# Patient Record
Sex: Female | Born: 1975 | Race: Black or African American | Hispanic: No | Marital: Married | Smoking: Never smoker
Health system: Southern US, Community
[De-identification: ages and names within clinical notes are randomized; demographics above are authoritative.]

## PROBLEM LIST (undated history)

## (undated) DIAGNOSIS — G43909 Migraine, unspecified, not intractable, without status migrainosus: Secondary | ICD-10-CM

## (undated) DIAGNOSIS — D649 Anemia, unspecified: Secondary | ICD-10-CM

## (undated) HISTORY — PX: TUBAL LIGATION: SHX77

---

## 2016-05-12 ENCOUNTER — Encounter (HOSPITAL_COMMUNITY): Payer: Self-pay | Admitting: Emergency Medicine

## 2016-05-12 ENCOUNTER — Emergency Department (HOSPITAL_COMMUNITY)
Admission: EM | Admit: 2016-05-12 | Discharge: 2016-05-12 | Disposition: A | Discharge status: Home / Self Care | Payer: Self-pay | Attending: Emergency Medicine | Admitting: Emergency Medicine

## 2016-05-12 DIAGNOSIS — N9489 Other specified conditions associated with female genital organs and menstrual cycle: Secondary | ICD-10-CM | POA: Insufficient documentation

## 2016-05-12 DIAGNOSIS — E876 Hypokalemia: Secondary | ICD-10-CM | POA: Insufficient documentation

## 2016-05-12 DIAGNOSIS — R55 Syncope and collapse: Secondary | ICD-10-CM

## 2016-05-12 DIAGNOSIS — E861 Hypovolemia: Secondary | ICD-10-CM | POA: Insufficient documentation

## 2016-05-12 HISTORY — DX: Anemia, unspecified: D64.9

## 2016-05-12 LAB — I-STAT BETA HCG BLOOD, ED (MC, WL, AP ONLY)

## 2016-05-12 LAB — BASIC METABOLIC PANEL
Anion gap: 5 (ref 5–15)
BUN: 9 mg/dL (ref 6–20)
CALCIUM: 8.3 mg/dL — AB (ref 8.9–10.3)
CO2: 21 mmol/L — ABNORMAL LOW (ref 22–32)
CREATININE: 0.67 mg/dL (ref 0.44–1.00)
Chloride: 111 mmol/L (ref 101–111)
GFR calc Af Amer: >60 mL/min (ref >60)
GLUCOSE: 96 mg/dL (ref 65–99)
Potassium: 3.2 mmol/L — ABNORMAL LOW (ref 3.5–5.1)
SODIUM: 137 mmol/L (ref 135–145)

## 2016-05-12 LAB — CBC
HEMATOCRIT: 40.6 % (ref 36.0–46.0)
Hemoglobin: 12.6 g/dL (ref 12.0–15.0)
MCH: 24.8 pg — ABNORMAL LOW (ref 26.0–34.0)
MCHC: 31 g/dL (ref 30.0–36.0)
MCV: 79.9 fL (ref 78.0–100.0)
PLATELETS: 224 10*3/uL (ref 150–400)
RBC: 5.08 MIL/uL (ref 3.87–5.11)
RDW: 14.4 % (ref 11.5–15.5)
WBC: 5.7 10*3/uL (ref 4.0–10.5)

## 2016-05-12 LAB — CBG MONITORING, ED: Glucose-Capillary: 96 mg/dL (ref 65–99)

## 2016-05-12 MED ORDER — POTASSIUM CHLORIDE CRYS ER 20 MEQ PO TBCR: 20.0000 meq | EXTENDED_RELEASE_TABLET | Freq: Every day | ORAL | 0 refills | Status: DC

## 2016-05-12 MED ORDER — SODIUM CHLORIDE 0.9 % IV BOLUS (SEPSIS)
1000.0000 mL | Freq: Once | INTRAVENOUS | Status: AC
  Administered 2016-05-12: 1000 mL via INTRAVENOUS

## 2016-05-12 NOTE — ED Notes (Signed)
Pt's was asked if she could give a urine sample, but she stated that she cannot but will try later.

## 2016-05-12 NOTE — Discharge Instructions (Signed)
Do not hesitate to return to the emergency room for any new, worsening or concerning symptoms. ° °Please obtain primary care using resource guide below. Let them know that you were seen in the emergency room and that they will need to obtain records for further outpatient management. ° ° °

## 2016-05-12 NOTE — ED Notes (Signed)
While standing during orthostatic vitals, pt stated that she felt "lightheaded, nauseous and her head hurt". Informed Brooke - RN and Joni ReiningNicole - PA.

## 2016-05-12 NOTE — ED Triage Notes (Signed)
Pt presents to the ED via EMS. Patient reports giving plasma earlier today. Pt reports about 2 hours ago she gave plasma. Per EMS Pt was at the grocery store and lost consciousness for about 4 minutes. Per EMS Pt lost consciousness again on the way to the hospital.

## 2016-05-12 NOTE — ED Provider Notes (Signed)
[facility]-EMERGENCY DEPT Provider Note   CSN: [id number] Arrival date & time: 05/12/16  1339     History   Chief Complaint Chief Complaint  Patient presents with  . Loss of Consciousness    HPI  Blood pressure 91/70, pulse (!) 58, temperature 98.4 F (36.9 C), temperature source Oral, resp. rate 18, height 5\' 5"  (1.651 m), weight 90.7 kg, last menstrual period 05/03/2016, SpO2 97 %.  Caroline King is a 40 y.o. female complaining of syncopal event just prior to arrival, patient gave plasma this a.m., she's been in normal state of health leading up to this. She went to the grocery store after giving plasma she was in the checkout line and she began to feel shaky, lightheaded, nauseous she did syncopized, she has a left occipital headache or she thinks she hit her head. There was no tonic-clonic movement reported by bystanders, no incontinence. She does have chronic anemia which she says is secondary to heavy menses. Last menstrual period was August 26 that lasted for 7 days which is typical for her, she doesn't have OB/GYN because she recently moved to the area and doesn't have insurance. She started taking over-the-counter iron supplements several days ago. She denies chest pain, palpitations, shortness of breath, abdominal pain, history of DVT/PE, recent mobilizations, calf pain, leg swelling.  HPI  Past Medical History:  Diagnosis Date  . Anemia     There are no active problems to display for this patient.   Past Surgical History:  Procedure Laterality Date  . CESAREAN SECTION      OB History    No data available       Home Medications    Prior to Admission medications   Medication Sig Start Date End Date Taking? Authorizing Provider  potassium chloride SA (K-DUR,KLOR-CON) 20 MEQ tablet Take 1 tablet (20 mEq total) by mouth daily. 05/12/16   Joni ReiningNicole Maxine Fredman, PA-C    Family History No family history on file.  Social History Social History  Substance Use Topics  .  Smoking status: Never Smoker  . Smokeless tobacco: Never Used  . Alcohol use No     Allergies   Review of patient's allergies indicates no known allergies.   Review of Systems Review of Systems  10 systems reviewed and found to be negative, except as noted in the HPI.   Physical Exam Updated Vital Signs BP 94/73   Pulse (!) 55   Temp 98.4 F (36.9 C) (Oral)   Resp 15   Ht 5\' 5"  (1.651 m)   Wt 90.7 kg   LMP 05/03/2016 (Exact Date)   SpO2 100%   BMI 33.28 kg/m   Physical Exam  Constitutional: She is oriented to person, place, and time. She appears well-developed and well-nourished. No distress.  HENT:  Head: Normocephalic and atraumatic.  Mouth/Throat: Oropharynx is clear and moist.  No conjunctival pallor  Eyes: Conjunctivae and EOM are normal. Pupils are equal, round, and reactive to light.  Neck: Normal range of motion. No JVD present. No tracheal deviation present.  Cardiovascular: Normal rate, regular rhythm and intact distal pulses.   Radial pulse equal bilaterally  Pulmonary/Chest: Effort normal and breath sounds normal. No stridor. No respiratory distress. She has no wheezes. She has no rales. She exhibits no tenderness.  Abdominal: Soft. She exhibits no distension and no mass. There is no tenderness. There is no rebound and no guarding. No hernia.  Musculoskeletal: Normal range of motion. She exhibits no edema or tenderness.  No calf  asymmetry, superficial collaterals, palpable cords, edema, Homans sign negative bilaterally.    Neurological: She is alert and oriented to person, place, and time.  Skin: Skin is warm. She is not diaphoretic.  Psychiatric: She has a normal mood and affect.  Nursing note and vitals reviewed.    ED Treatments / Results  Labs (all labs ordered are listed, but only abnormal results are displayed) Labs Reviewed  BASIC METABOLIC PANEL - Abnormal; Notable for the following:       Result Value   Potassium 3.2 (*)    CO2 21 (*)      Calcium 8.3 (*)    All other components within normal limits  CBC - Abnormal; Notable for the following:    MCH 24.8 (*)    All other components within normal limits  CBG MONITORING, ED  I-STAT BETA HCG BLOOD, ED ([facility], [facility], [facility] ONLY)    EKG  EKG Interpretation  Date/Time:  Monday May 12 2016 13:56:07 EDT Ventricular Rate:  59 PR Interval:    QRS Duration: 94 QT Interval:  416 QTC Calculation: 413 R Axis:   68 Text Interpretation:  Sinus rhythm Borderline T abnormalities, anterior leads No old tracing to compare Confirmed by Ethelda Chick  MD, SAM [phone]) on 05/12/2016 2:28:10 PM       Radiology No results found.  Procedures Procedures (including critical care time)  Medications Ordered in ED Medications  sodium chloride 0.9 % bolus 1,000 mL (1,000 mLs Intravenous New Bag/Given 05/12/16 1430)     Initial Impression / Assessment and Plan / ED Course  I have reviewed the triage vital signs and the nursing notes.  Pertinent labs & imaging results that were available during my care of the patient were reviewed by me and considered in my medical decision making (see chart for details).  Clinical Course    Vitals:   05/12/16 1345 05/12/16 1346 05/12/16 1500 05/12/16 1515  BP: 91/70  97/71 94/73  Pulse: (!) 58  (!) 58 (!) 55  Resp: 18  23 15   Temp: 98.4 F (36.9 C)     TempSrc: Oral     SpO2: 97%  98% 100%  Weight:  90.7 kg    Height:  5\' 5"  (1.651 m)      Medications  sodium chloride 0.9 % bolus 1,000 mL (1,000 mLs Intravenous New Bag/Given 05/12/16 1430)    Caroline King is 40 y.o. female presenting with Syncope after giving plasma earlier in the day, there was a prodrome of lightheadedness, nausea. No tonic-clonic movement or incontinence to suggest seizure. She is anemic at her baseline and is taking iron supplements. No anemia seen on CBC, bicarbonate is mildly low at 21 and potassium is 3.2. She's not pregnant. No abnormality on abdominal exam. EKG is without  arrhythmia or ischemic changes. Physical exam is not consistent with DVT and I doubt PE.  Agent is reporting lightheaded sensation when she stands however there is no significant drop in her systolic blood pressure. I think patient is dehydrated, but advised her to push fluids, do not stand quickly. She will be given a referral to OB/GYN for heavy menses.  Evaluation does not show pathology that would require ongoing emergent intervention or inpatient treatment. Pt is hemodynamically stable and mentating appropriately. Discussed findings and plan with patient/guardian, who agrees with care plan. All questions answered. Return precautions discussed and outpatient follow up given.      Final Clinical Impressions(s) / ED Diagnoses   Final diagnoses:  Syncope,  unspecified syncope type  Hypovolemia  Hypokalemia    New Prescriptions New Prescriptions   POTASSIUM CHLORIDE SA (K-DUR,KLOR-CON) 20 MEQ TABLET    Take 1 tablet (20 mEq total) by mouth daily.     Wynetta Emery, PA-C 05/12/16 1556    Doug Sou, MD 05/12/16 1718

## 2017-09-16 ENCOUNTER — Encounter (HOSPITAL_COMMUNITY): Payer: Self-pay | Admitting: *Deleted

## 2017-09-16 ENCOUNTER — Emergency Department (HOSPITAL_COMMUNITY)
Admission: EM | Admit: 2017-09-16 | Discharge: 2017-09-16 | Disposition: A | Discharge status: Home / Self Care | Payer: Self-pay | Attending: Emergency Medicine | Admitting: Emergency Medicine

## 2017-09-16 ENCOUNTER — Emergency Department (HOSPITAL_COMMUNITY): Payer: Self-pay

## 2017-09-16 DIAGNOSIS — Y9389 Activity, other specified: Secondary | ICD-10-CM | POA: Insufficient documentation

## 2017-09-16 DIAGNOSIS — S301XXA Contusion of abdominal wall, initial encounter: Secondary | ICD-10-CM | POA: Insufficient documentation

## 2017-09-16 DIAGNOSIS — Y999 Unspecified external cause status: Secondary | ICD-10-CM | POA: Insufficient documentation

## 2017-09-16 DIAGNOSIS — Y92410 Unspecified street and highway as the place of occurrence of the external cause: Secondary | ICD-10-CM | POA: Insufficient documentation

## 2017-09-16 LAB — I-STAT CHEM 8, ED
BUN: 10 mg/dL (ref 6–20)
Calcium, Ion: 1.27 mmol/L (ref 1.15–1.40)
Chloride: 105 mmol/L (ref 101–111)
Creatinine, Ser: 0.8 mg/dL (ref 0.44–1.00)
Glucose, Bld: 102 mg/dL — ABNORMAL HIGH (ref 65–99)
HCT: 33 % — ABNORMAL LOW (ref 36.0–46.0)
Hemoglobin: 11.2 g/dL — ABNORMAL LOW (ref 12.0–15.0)
POTASSIUM: 4.2 mmol/L (ref 3.5–5.1)
SODIUM: 142 mmol/L (ref 135–145)
TCO2: 25 mmol/L (ref 22–32)

## 2017-09-16 LAB — CBC WITH DIFFERENTIAL/PLATELET
BASOS PCT: 0 %
Basophils Absolute: 0 10*3/uL (ref 0.0–0.1)
EOS PCT: 3 %
Eosinophils Absolute: 0.2 10*3/uL (ref 0.0–0.7)
HEMATOCRIT: 33.1 % — AB (ref 36.0–46.0)
Hemoglobin: 10.1 g/dL — ABNORMAL LOW (ref 12.0–15.0)
Lymphocytes Relative: 26 %
Lymphs Abs: 1.9 10*3/uL (ref 0.7–4.0)
MCH: 23.6 pg — ABNORMAL LOW (ref 26.0–34.0)
MCHC: 30.5 g/dL (ref 30.0–36.0)
MCV: 77.3 fL — ABNORMAL LOW (ref 78.0–100.0)
MONO ABS: 0.8 10*3/uL (ref 0.1–1.0)
MONOS PCT: 11 %
Neutro Abs: 4.5 10*3/uL (ref 1.7–7.7)
Neutrophils Relative %: 60 %
PLATELETS: 394 10*3/uL (ref 150–400)
RBC: 4.28 MIL/uL (ref 3.87–5.11)
RDW: 15.6 % — AB (ref 11.5–15.5)
WBC: 7.4 10*3/uL (ref 4.0–10.5)

## 2017-09-16 LAB — COMPREHENSIVE METABOLIC PANEL
ALBUMIN: 3.6 g/dL (ref 3.5–5.0)
ALT: 11 U/L — ABNORMAL LOW (ref 14–54)
ANION GAP: 7 (ref 5–15)
AST: 22 U/L (ref 15–41)
Alkaline Phosphatase: 62 U/L (ref 38–126)
BILIRUBIN TOTAL: 0.3 mg/dL (ref 0.3–1.2)
BUN: 12 mg/dL (ref 6–20)
CO2: 24 mmol/L (ref 22–32)
Calcium: 9.4 mg/dL (ref 8.9–10.3)
Chloride: 108 mmol/L (ref 101–111)
Creatinine, Ser: 0.82 mg/dL (ref 0.44–1.00)
GFR calc Af Amer: >60 mL/min (ref >60)
Glucose, Bld: 105 mg/dL — ABNORMAL HIGH (ref 65–99)
POTASSIUM: 3.9 mmol/L (ref 3.5–5.1)
Sodium: 139 mmol/L (ref 135–145)
TOTAL PROTEIN: 7.5 g/dL (ref 6.5–8.1)

## 2017-09-16 LAB — URINALYSIS, ROUTINE W REFLEX MICROSCOPIC
BILIRUBIN URINE: NEGATIVE
GLUCOSE, UA: NEGATIVE mg/dL
KETONES UR: NEGATIVE mg/dL
Leukocytes, UA: NEGATIVE
NITRITE: NEGATIVE
Protein, ur: NEGATIVE mg/dL
Specific Gravity, Urine: 1.005 (ref 1.005–1.030)
pH: 7 (ref 5.0–8.0)

## 2017-09-16 LAB — I-STAT BETA HCG BLOOD, ED (MC, WL, AP ONLY)

## 2017-09-16 MED ORDER — IOPAMIDOL (ISOVUE-300) INJECTION 61%
100.0000 mL | Freq: Once | INTRAVENOUS | Status: AC | PRN
  Administered 2017-09-16: 100 mL via INTRAVENOUS

## 2017-09-16 MED ORDER — SODIUM CHLORIDE 0.9 % IV BOLUS (SEPSIS)
1000.0000 mL | Freq: Once | INTRAVENOUS | Status: AC
  Administered 2017-09-16: 1000 mL via INTRAVENOUS

## 2017-09-16 MED ORDER — IOPAMIDOL (ISOVUE-300) INJECTION 61%
INTRAVENOUS | Status: AC
  Filled 2017-09-16: qty 100

## 2017-09-16 MED ORDER — IBUPROFEN 600 MG PO TABS: 600.0000 mg | ORAL_TABLET | Freq: Four times a day (QID) | ORAL | 0 refills | Status: DC | PRN

## 2017-09-16 MED ORDER — HYDROMORPHONE HCL 1 MG/ML IJ SOLN
1.0000 mg | Freq: Once | INTRAMUSCULAR | Status: AC
  Administered 2017-09-16: 1 mg via INTRAVENOUS
  Filled 2017-09-16: qty 1

## 2017-09-16 MED ORDER — BACITRACIN ZINC 500 UNIT/GM EX OINT: 1.0000 "application " | TOPICAL_OINTMENT | Freq: Two times a day (BID) | CUTANEOUS | 0 refills | Status: DC
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
## 2017-09-16 NOTE — ED Notes (Signed)
MADE 1ST URINE REQUEST PATIENT UNABLE TO PROVIDE ONE AT THIS TIME 

## 2018-01-15 ENCOUNTER — Encounter (HOSPITAL_COMMUNITY): Payer: Self-pay | Admitting: Emergency Medicine

## 2018-01-15 ENCOUNTER — Emergency Department (HOSPITAL_COMMUNITY)
Admission: EM | Admit: 2018-01-15 | Discharge: 2018-01-15 | Disposition: A | Discharge status: Home / Self Care | Payer: 59 | Attending: Emergency Medicine | Admitting: Emergency Medicine

## 2018-01-15 DIAGNOSIS — G43909 Migraine, unspecified, not intractable, without status migrainosus: Secondary | ICD-10-CM | POA: Diagnosis not present

## 2018-01-15 DIAGNOSIS — H9319 Tinnitus, unspecified ear: Secondary | ICD-10-CM | POA: Diagnosis not present

## 2018-01-15 DIAGNOSIS — R51 Headache: Secondary | ICD-10-CM | POA: Diagnosis not present

## 2018-01-15 DIAGNOSIS — R112 Nausea with vomiting, unspecified: Secondary | ICD-10-CM | POA: Diagnosis not present

## 2018-01-15 DIAGNOSIS — R42 Dizziness and giddiness: Secondary | ICD-10-CM

## 2018-01-15 DIAGNOSIS — H9209 Otalgia, unspecified ear: Secondary | ICD-10-CM | POA: Insufficient documentation

## 2018-01-15 HISTORY — DX: Migraine, unspecified, not intractable, without status migrainosus: G43.909

## 2018-01-15 LAB — COMPREHENSIVE METABOLIC PANEL
ALBUMIN: 3.5 g/dL (ref 3.5–5.0)
ALK PHOS: 69 U/L (ref 38–126)
ALT: 11 U/L — ABNORMAL LOW (ref 14–54)
ANION GAP: 9 (ref 5–15)
AST: 17 U/L (ref 15–41)
BILIRUBIN TOTAL: 0.2 mg/dL — AB (ref 0.3–1.2)
BUN: 14 mg/dL (ref 6–20)
CALCIUM: 8.9 mg/dL (ref 8.9–10.3)
CO2: 20 mmol/L — ABNORMAL LOW (ref 22–32)
Chloride: 109 mmol/L (ref 101–111)
Creatinine, Ser: 0.78 mg/dL (ref 0.44–1.00)
GLUCOSE: 93 mg/dL (ref 65–99)
Potassium: 4 mmol/L (ref 3.5–5.1)
Sodium: 138 mmol/L (ref 135–145)
TOTAL PROTEIN: 7.7 g/dL (ref 6.5–8.1)

## 2018-01-15 LAB — URINALYSIS, ROUTINE W REFLEX MICROSCOPIC
BILIRUBIN URINE: NEGATIVE
Glucose, UA: NEGATIVE mg/dL
HGB URINE DIPSTICK: NEGATIVE
Ketones, ur: NEGATIVE mg/dL
Leukocytes, UA: NEGATIVE
NITRITE: POSITIVE — AB
Protein, ur: NEGATIVE mg/dL
SPECIFIC GRAVITY, URINE: 1.013 (ref 1.005–1.030)
pH: 7 (ref 5.0–8.0)

## 2018-01-15 LAB — CBC
HCT: 33.9 % — ABNORMAL LOW (ref 36.0–46.0)
HEMOGLOBIN: 10.3 g/dL — AB (ref 12.0–15.0)
MCH: 23.2 pg — ABNORMAL LOW (ref 26.0–34.0)
MCHC: 30.4 g/dL (ref 30.0–36.0)
MCV: 76.4 fL — ABNORMAL LOW (ref 78.0–100.0)
Platelets: 383 10*3/uL (ref 150–400)
RBC: 4.44 MIL/uL (ref 3.87–5.11)
RDW: 16.3 % — ABNORMAL HIGH (ref 11.5–15.5)
WBC: 4.6 10*3/uL (ref 4.0–10.5)

## 2018-01-15 LAB — I-STAT BETA HCG BLOOD, ED (MC, WL, AP ONLY): I-stat hCG, quantitative: <5 m[IU]/mL (ref <5)

## 2018-01-15 LAB — LIPASE, BLOOD: Lipase: 40 U/L (ref 11–51)

## 2018-01-15 MED ORDER — MECLIZINE HCL 25 MG PO TABS
25.0000 mg | ORAL_TABLET | Freq: Once | ORAL | Status: AC
  Administered 2018-01-15: 25 mg via ORAL
  Filled 2018-01-15: qty 1

## 2018-01-15 MED ORDER — ONDANSETRON 4 MG PO TBDP
4.0000 mg | ORAL_TABLET | Freq: Once | ORAL | Status: AC | PRN
  Administered 2018-01-15: 4 mg via ORAL
  Filled 2018-01-15: qty 1

## 2018-01-15 MED ORDER — SODIUM CHLORIDE 0.9 % IV BOLUS
1000.0000 mL | Freq: Once | INTRAVENOUS | Status: AC
  Administered 2018-01-15: 1000 mL via INTRAVENOUS

## 2018-01-15 MED ORDER — METOCLOPRAMIDE HCL 5 MG/ML IJ SOLN
10.0000 mg | Freq: Once | INTRAMUSCULAR | Status: AC
  Administered 2018-01-15: 10 mg via INTRAVENOUS
  Filled 2018-01-15: qty 2

## 2018-01-15 MED ORDER — MECLIZINE HCL 25 MG PO TABS: 25.0000 mg | ORAL_TABLET | Freq: Three times a day (TID) | ORAL | 0 refills | Status: DC | PRN

## 2018-01-15 MED ORDER — KETOROLAC TROMETHAMINE 30 MG/ML IJ SOLN
30.0000 mg | Freq: Once | INTRAMUSCULAR | Status: AC
  Administered 2018-01-15: 30 mg via INTRAVENOUS
  Filled 2018-01-15: qty 1

## 2018-01-15 NOTE — Discharge Instructions (Addendum)
You were evaluated in the emergency department for acute spinning sensation when you woke up this morning.  This is likely vertigo and you were improved after taking some medication for headache and dizziness.  We are prescribing you meclizine to go home with as this may help limit your symptoms.  You should return to the emergency department if you have any worsening of your symptoms or any new concerns.

## 2018-01-15 NOTE — ED Provider Notes (Signed)
[facility]-EMERGENCY DEPT Provider Note   CSN: [id number] Arrival date & time: 01/15/18  4098     History   Chief Complaint Chief Complaint  Patient presents with  . Dizziness  . Otalgia  . Emesis  . Headache    HPI Caroline King is a 42 y.o. female.  42 year old female with history of migraines here with acute onset of room spinning when she got up out of bed this morning.  She is never had this before.  She states she feels drunk and unsteady on her feet. The associated with nausea and vomiting and is giving her headache.  She is a headache is like her migraines but her room spinning is something that she is never had before.  There is been no fever no trauma.  No new medications.  The history is provided by the patient.  Dizziness  Quality:  Head spinning and room spinning Severity:  Severe Onset quality:  Sudden Timing:  Intermittent Progression:  Partially resolved Chronicity:  New Context: head movement and standing up   Context: not with loss of consciousness   Relieved by:  Being still Worsened by:  Movement, standing up and turning head Associated symptoms: headaches, nausea, tinnitus and vomiting   Associated symptoms: no blood in stool, no chest pain, no diarrhea, no shortness of breath and no vision changes   Risk factors: anemia   Risk factors: no hx of vertigo   Otalgia  Associated symptoms include headaches and vomiting. Pertinent negatives include no sore throat, no abdominal pain, no diarrhea and no rash.  Emesis   Associated symptoms include headaches. Pertinent negatives include no abdominal pain, no diarrhea and no fever.  Headache   Associated symptoms include nausea and vomiting. Pertinent negatives include no fever and no shortness of breath.    Past Medical History:  Diagnosis Date  . Anemia   . Migraines     There are no active problems to display for this patient.   Past Surgical History:  Procedure Laterality  Date  . CESAREAN SECTION       OB History   None      Home Medications    Prior to Admission medications   Medication Sig Start Date End Date Taking? Authorizing Provider  bacitracin ointment Apply 1 application topically 2 (two) times daily. 09/16/17   Derwood Kaplan, MD  ibuprofen (ADVIL,MOTRIN) 600 MG tablet Take 1 tablet (600 mg total) by mouth every 6 (six) hours as needed. 09/16/17   Derwood Kaplan, MD  potassium chloride SA (K-DUR,KLOR-CON) 20 MEQ tablet Take 1 tablet (20 mEq total) by mouth daily. Patient not taking: Reported on 09/16/2017 05/12/16   Pisciotta, Mardella Layman    Family History No family history on file.  Social History Social History   Tobacco Use  . Smoking status: Never Smoker  . Smokeless tobacco: Never Used  Substance Use Topics  . Alcohol use: No  . Drug use: No     Allergies   Patient has no known allergies.   Review of Systems Review of Systems  Constitutional: Negative for fever.  HENT: Positive for ear pain and tinnitus. Negative for sore throat.   Respiratory: Negative for shortness of breath.   Cardiovascular: Negative for chest pain.  Gastrointestinal: Positive for nausea and vomiting. Negative for abdominal pain, blood in stool and diarrhea.  Genitourinary: Negative for dysuria.  Skin: Negative for rash.  Neurological: Positive for dizziness and headaches.     Physical Exam Updated Vital  Signs BP 112/88 (BP Location: Left Arm)   Pulse 65   Temp 98.4 F (36.9 C) (Oral)   Resp 18   Wt 94.6 kg (208 lb 9 oz)   LMP 12/28/2017   SpO2 98%   BMI 34.71 kg/m   Physical Exam  Constitutional: She is oriented to person, place, and time. She appears well-developed and well-nourished. No distress.  HENT:  Head: Normocephalic and atraumatic.  Eyes: Pupils are equal, round, and reactive to light. Conjunctivae are normal. Right eye exhibits nystagmus. Right eye exhibits normal extraocular motion. Left eye exhibits nystagmus. Left eye  exhibits normal extraocular motion.  Neck: Neck supple.  Cardiovascular: Normal rate and regular rhythm.  No murmur heard. Pulmonary/Chest: Effort normal and breath sounds normal. No respiratory distress.  Abdominal: Soft. There is no tenderness.  Musculoskeletal: She exhibits no edema.  Neurological: She is alert and oriented to person, place, and time. She has normal strength. A cranial nerve deficit is present. No sensory deficit. GCS eye subscore is 4. GCS verbal subscore is 5. GCS motor subscore is 6.  Skin: Skin is warm and dry.  Psychiatric: She has a normal mood and affect.  Nursing note and vitals reviewed.    ED Treatments / Results  Labs (all labs ordered are listed, but only abnormal results are displayed) Labs Reviewed  COMPREHENSIVE METABOLIC PANEL - Abnormal; Notable for the following components:      Result Value   CO2 20 (*)    ALT 11 (*)    Total Bilirubin 0.2 (*)    All other components within normal limits  CBC - Abnormal; Notable for the following components:   Hemoglobin 10.3 (*)    HCT 33.9 (*)    MCV 76.4 (*)    MCH 23.2 (*)    RDW 16.3 (*)    All other components within normal limits  LIPASE, BLOOD  URINALYSIS, ROUTINE W REFLEX MICROSCOPIC  I-STAT BETA HCG BLOOD, ED ([facility], [facility], [facility] ONLY)    EKG None  Radiology No results found.  Procedures Procedures (including critical care time)  Medications Ordered in ED Medications  metoCLOPramide (REGLAN) injection 10 mg (has no administration in time range)  ketorolac (TORADOL) 30 MG/ML injection 30 mg (has no administration in time range)  sodium chloride 0.9 % bolus 1,000 mL (has no administration in time range)  meclizine (ANTIVERT) tablet 25 mg (has no administration in time range)  ondansetron (ZOFRAN-ODT) disintegrating tablet 4 mg (4 mg Oral Given 01/15/18 0800)     Initial Impression / Assessment and Plan / ED Course  I have reviewed the triage vital signs and the nursing notes.  Pertinent  labs & imaging results that were available during my care of the patient were reviewed by me and considered in my medical decision making (see chart for details).  Clinical Course as of Jan 17 1342  Fri Jan 15, 2018  1412 Reevaluated patient, her headache is improved and she is ambulated to the bathroom and said her dizziness was better to you.  She still feeling pretty tired from the medications of S a little bit more and then likely she can be discharged.   [MB]  1438 Patient feels well enough to go home and is calling for a ride.   [MB]    Clinical Course User Index [MB] Terrilee Files, MD     Final Clinical Impressions(s) / ED Diagnoses   Final diagnoses:  Vertigo  Migraine without status migrainosus, not intractable, unspecified migraine  type    ED Discharge Orders    None       Terrilee Files, MD 01/17/18 1343

## 2018-01-15 NOTE — ED Triage Notes (Signed)
Pt reports when she woke up when alarm went off felt dizzy like someone was spinning her around.  Pt had bilat ear pains and vomiting. Reports migraines hx and having headache this morning.

## 2018-03-24 ENCOUNTER — Emergency Department (HOSPITAL_COMMUNITY): Payer: 59

## 2018-03-24 ENCOUNTER — Encounter (HOSPITAL_COMMUNITY): Payer: Self-pay | Admitting: Emergency Medicine

## 2018-03-24 ENCOUNTER — Emergency Department (HOSPITAL_COMMUNITY)
Admission: EM | Admit: 2018-03-24 | Discharge: 2018-03-25 | Disposition: A | Discharge status: Home / Self Care | Payer: 59 | Attending: Emergency Medicine | Admitting: Emergency Medicine

## 2018-03-24 DIAGNOSIS — Z79899 Other long term (current) drug therapy: Secondary | ICD-10-CM | POA: Diagnosis not present

## 2018-03-24 DIAGNOSIS — R0602 Shortness of breath: Secondary | ICD-10-CM

## 2018-03-24 DIAGNOSIS — R51 Headache: Secondary | ICD-10-CM | POA: Diagnosis present

## 2018-03-24 DIAGNOSIS — D649 Anemia, unspecified: Secondary | ICD-10-CM

## 2018-03-24 DIAGNOSIS — R519 Headache, unspecified: Secondary | ICD-10-CM

## 2018-03-24 LAB — BASIC METABOLIC PANEL
Anion gap: 6 (ref 5–15)
BUN: 13 mg/dL (ref 6–20)
CO2: 22 mmol/L (ref 22–32)
Calcium: 8.6 mg/dL — ABNORMAL LOW (ref 8.9–10.3)
Chloride: 111 mmol/L (ref 98–111)
Creatinine, Ser: 0.64 mg/dL (ref 0.44–1.00)
GFR calc Af Amer: >60 mL/min (ref >60)
GFR calc non Af Amer: >60 mL/min (ref >60)
Glucose, Bld: 98 mg/dL (ref 70–99)
Potassium: 3.8 mmol/L (ref 3.5–5.1)
Sodium: 139 mmol/L (ref 135–145)

## 2018-03-24 LAB — CBC WITH DIFFERENTIAL/PLATELET
Basophils Absolute: 0 10*3/uL (ref 0.0–0.1)
Basophils Relative: 0 %
Eosinophils Absolute: 0.1 10*3/uL (ref 0.0–0.7)
Eosinophils Relative: 2 %
HCT: 32 % — ABNORMAL LOW (ref 36.0–46.0)
Hemoglobin: 9.8 g/dL — ABNORMAL LOW (ref 12.0–15.0)
Lymphocytes Relative: 28 %
Lymphs Abs: 1.3 10*3/uL (ref 0.7–4.0)
MCH: 23.9 pg — ABNORMAL LOW (ref 26.0–34.0)
MCHC: 30.6 g/dL (ref 30.0–36.0)
MCV: 78 fL (ref 78.0–100.0)
Monocytes Absolute: 0.2 10*3/uL (ref 0.1–1.0)
Monocytes Relative: 4 %
Neutro Abs: 3 10*3/uL (ref 1.7–7.7)
Neutrophils Relative %: 66 %
Platelets: 405 10*3/uL — ABNORMAL HIGH (ref 150–400)
RBC: 4.1 MIL/uL (ref 3.87–5.11)
RDW: 16.9 % — ABNORMAL HIGH (ref 11.5–15.5)
WBC: 4.6 10*3/uL (ref 4.0–10.5)

## 2018-03-24 LAB — I-STAT BETA HCG BLOOD, ED (MC, WL, AP ONLY): I-stat hCG, quantitative: <5 m[IU]/mL (ref <5)

## 2018-03-24 MED ORDER — KETOROLAC TROMETHAMINE 15 MG/ML IJ SOLN
15.0000 mg | Freq: Once | INTRAMUSCULAR | Status: AC
  Administered 2018-03-24: 15 mg via INTRAVENOUS
  Filled 2018-03-24: qty 1

## 2018-03-24 MED ORDER — DIPHENHYDRAMINE HCL 50 MG/ML IJ SOLN
25.0000 mg | Freq: Once | INTRAMUSCULAR | Status: AC
  Administered 2018-03-24: 25 mg via INTRAVENOUS
  Filled 2018-03-24: qty 1

## 2018-03-24 MED ORDER — METOCLOPRAMIDE HCL 5 MG/ML IJ SOLN
10.0000 mg | Freq: Once | INTRAMUSCULAR | Status: AC
  Administered 2018-03-24: 10 mg via INTRAVENOUS
  Filled 2018-03-24: qty 2

## 2018-03-24 MED ORDER — DEXAMETHASONE SODIUM PHOSPHATE 10 MG/ML IJ SOLN
10.0000 mg | Freq: Once | INTRAMUSCULAR | Status: AC
  Administered 2018-03-24: 10 mg via INTRAVENOUS
  Filled 2018-03-24: qty 1

## 2018-03-24 MED ORDER — SODIUM CHLORIDE 0.9 % IV BOLUS
1000.0000 mL | Freq: Once | INTRAVENOUS | Status: AC
  Administered 2018-03-24: 1000 mL via INTRAVENOUS

## 2018-03-24 NOTE — Discharge Instructions (Addendum)
Your lab work today was reassuring although you are anemic.  Chest x-ray did not show any signs of pneumonia or fluid in the lungs.  Call the number on the back of your insurance card or go on the [facility] website to find a PCP that you can follow-up with.  They will help with your migraines and can help figure out why you are anemic and possibly start you on medications.  Please return to the emergency department if any concerning signs or symptoms develop such as passing out, worsening chest pain or shortness of breath, worsening headaches, fevers, or weakness.

## 2018-03-24 NOTE — ED Provider Notes (Signed)
[facility]-EMERGENCY DEPT Provider Note   CSN: [id number] Arrival date & time: 03/24/18  1404     History   Chief Complaint Chief Complaint  Patient presents with  . Headache  . Shortness of Breath  . Emesis    HPI Caroline King is a 42 y.o. female with history of anemia and migraines presents today for evaluation of gradual onset, per aggressively worsening, persistent left-sided headache since yesterday evening as well as shortness of breath for 1 month.  She notes that the headache is consistent with her usual migraines and describes it as a sharp stabbing sensation that begins in the left temple and radiates down to the left occipital region.  She denies any numbness, tingling, or weakness.  She does endorse photophobia.  No fevers or chills.  She usually takes oxycodone for her migraines but ran out of this and instead took Tylenol without any relief of her symptoms.  She did have one episode of nonbloody nonbilious emesis which she states "when it gets to that point I usually come into the emergency department for medication".  She also notes that for the past month she has felt intermittently short of breath.  She states that this can occur at rest and can last up to 30 minutes in duration.  She is not sure what triggers it.  She is unsure if it is exertional or related to any environmental exposures.  She states that her shortness of breath worsened today and she thinks it may be related to anxiety or her migraines.  She denies chest pain, cough, fevers, nausea, or vomiting.  She denies recent travel or surgeries, leg swelling, hemoptysis, or prior history of DVT or PE.  She is not on estrogen replacement therapy.  The history is provided by the patient.    Past Medical History:  Diagnosis Date  . Anemia   . Migraines     There are no active problems to display for this patient.   Past Surgical History:  Procedure Laterality Date  . CESAREAN SECTION        OB History   None      Home Medications    Prior to Admission medications   Medication Sig Start Date End Date Taking? Authorizing Provider  acetaminophen (TYLENOL) 500 MG tablet Take 1,000 mg by mouth every 6 (six) hours as needed for headache.   Yes [provider]  meclizine (ANTIVERT) 25 MG tablet Take 1 tablet (25 mg total) by mouth 3 (three) times daily as needed for dizziness. 01/15/18  Yes Terrilee FilesButler, Michael C, MD  bacitracin ointment Apply 1 application topically 2 (two) times daily. Patient not taking: Reported on 03/24/2018 09/16/17   Derwood KaplanNanavati, Ankit, MD  ibuprofen (ADVIL,MOTRIN) 600 MG tablet Take 1 tablet (600 mg total) by mouth every 6 (six) hours as needed. Patient not taking: Reported on 03/24/2018 09/16/17   Derwood KaplanNanavati, Ankit, MD  potassium chloride SA (K-DUR,KLOR-CON) 20 MEQ tablet Take 1 tablet (20 mEq total) by mouth daily. Patient not taking: Reported on 03/24/2018 05/12/16   Pisciotta, Joni ReiningNicole, PA-C    Family History No family history on file.  Social History Social History   Tobacco Use  . Smoking status: Never Smoker  . Smokeless tobacco: Never Used  Substance Use Topics  . Alcohol use: No  . Drug use: No     Allergies   Patient has no known allergies.   Review of Systems Review of Systems  Constitutional: Negative for chills and fever.  Eyes: Positive for photophobia. Negative for visual disturbance.  Respiratory: Positive for shortness of breath. Negative for cough.   Cardiovascular: Negative for chest pain and leg swelling.  Gastrointestinal: Positive for nausea and vomiting. Negative for abdominal pain.  Neurological: Positive for light-headedness and headaches. Negative for syncope, weakness and numbness.  All other systems reviewed and are negative.    Physical Exam Updated Vital Signs BP 102/75 (BP Location: Right Arm)   Pulse (!) 53   Temp 98.2 F (36.8 C) (Oral)   Resp 16   SpO2 98%   Physical Exam  Constitutional: She is  oriented to person, place, and time. She appears well-developed and well-nourished. No distress.  HENT:  Head: Normocephalic and atraumatic.  Mild tenderness to palpation of the left side of the skull with no deformity, crepitus, ecchymosis, or swelling noted.  No battle signs, no raccoon eyes, no rhinorrhea.  Eyes: Pupils are equal, round, and reactive to light. Conjunctivae and EOM are normal. Right eye exhibits no discharge. Left eye exhibits no discharge. Right eye exhibits normal extraocular motion and no nystagmus. Left eye exhibits normal extraocular motion and no nystagmus. Right pupil is round and reactive. Left pupil is round and reactive.  Neck: Normal range of motion. Neck supple. No JVD present. No tracheal deviation present.  Cardiovascular: Normal rate, regular rhythm, normal heart sounds and intact distal pulses.  2+ radial and DP/PT pulses bilaterally, Homans sign absent bilaterally, no lower extremity edema, no palpable cords, compartments are soft   Pulmonary/Chest: Effort normal and breath sounds normal. No stridor. No respiratory distress. She has no wheezes. She has no rales. She exhibits no tenderness.  Abdominal: Soft. Bowel sounds are normal. She exhibits no distension. There is no tenderness.  Musculoskeletal: She exhibits no edema.  Neurological: She is alert and oriented to person, place, and time. She has normal strength. Coordination and gait normal. GCS eye subscore is 4. GCS verbal subscore is 5. GCS motor subscore is 6.  Mental Status:  Alert, thought content appropriate, able to give a coherent history. Speech fluent without evidence of aphasia. Able to follow 2 step commands without difficulty.  Cranial Nerves:  II:  Peripheral visual fields grossly normal, pupils equal, round, reactive to light III,IV, VI: ptosis not present, extra-ocular motions intact bilaterally  V,VII: smile symmetric, facial light touch sensation equal VIII: hearing grossly normal to voice    X: uvula elevates symmetrically  XI: bilateral shoulder shrug symmetric and strong XII: midline tongue extension without fassiculations Motor:  Normal tone. 5/5 strength of BUE and BLE major muscle groups including strong and equal grip strength and dorsiflexion/plantar flexion Sensory: light touch normal in all extremities. Cerebellar: normal finger-to-nose with bilateral upper extremities Gait: normal gait and balance. Able to walk on toes and heels with ease.    Skin: Skin is warm and dry. No erythema.  Psychiatric: She has a normal mood and affect. Her behavior is normal.  Nursing note and vitals reviewed.    ED Treatments / Results  Labs (all labs ordered are listed, but only abnormal results are displayed) Labs Reviewed  CBC WITH DIFFERENTIAL/PLATELET - Abnormal; Notable for the following components:      Result Value   Hemoglobin 9.8 (*)    HCT 32.0 (*)    MCH 23.9 (*)    RDW 16.9 (*)    Platelets 405 (*)    All other components within normal limits  BASIC METABOLIC PANEL - Abnormal; Notable for the following components:  Calcium 8.6 (*)    All other components within normal limits  I-STAT BETA HCG BLOOD, ED ([facility], [facility], [facility] ONLY)    EKG EKG Interpretation  Date/Time:  Wednesday March 24 2018 14:45:05 EDT Ventricular Rate:  66 PR Interval:    QRS Duration: 92 QT Interval:  394 QTC Calculation: 413 R Axis:   60 Text Interpretation:  Sinus rhythm Consider left atrial enlargement Low voltage, precordial leads Borderline T abnormalities, anterior leads no change from previous Confirmed by Arby Barrette [phone]) on 03/24/2018 11:04:58 PM   Radiology Dg Chest 2 View  Result Date: 03/24/2018 CLINICAL DATA:  Shortness of breath, headache EXAM: CHEST - 2 VIEW COMPARISON:  None. FINDINGS: Lungs are clear.  No pleural effusion or pneumothorax. The heart is normal in size. Visualized osseous structures are within normal limits. IMPRESSION: Normal chest radiographs.  Electronically Signed   By: Charline Bills M.D.   On: 03/24/2018 18:31    Procedures Procedures (including critical care time)  Medications Ordered in ED Medications  sodium chloride 0.9 % bolus 1,000 mL (1,000 mLs Intravenous New Bag/Given 03/24/18 1907)  ketorolac (TORADOL) 15 MG/ML injection 15 mg (15 mg Intravenous Given 03/24/18 1857)  dexamethasone (DECADRON) injection 10 mg (10 mg Intravenous Given 03/24/18 1900)  metoCLOPramide (REGLAN) injection 10 mg (10 mg Intravenous Given 03/24/18 1858)  diphenhydrAMINE (BENADRYL) injection 25 mg (25 mg Intravenous Given 03/24/18 1856)     Initial Impression / Assessment and Plan / ED Course  I have reviewed the triage vital signs and the nursing notes.  Pertinent labs & imaging results that were available during my care of the patient were reviewed by me and considered in my medical decision making (see chart for details).    Patient presents with migraine headache since yesterday.  She is afebrile, vital signs are stable.  She is nontoxic in appearance.  Presentation is like patient's typical HA and non concerning for [facility], ICH, Meningitis, or temporal arteritis. Pt is afebrile with no focal neuro deficits, nuchal rigidity, or change in vision.  Patient was given migraine cocktail in the ED which included Decadron, Toradol, Reglan, Benadryl, and IV fluids.  On reevaluation she states that her headache has entirely resolved.  She is also been complaining of intermittent shortness of breath for 1 month.  No associated chest pains.  Chest x-ray shows no acute cardiopulmonary abnormalities.  She was ambulatory on pulse ox without desaturations.  EKG shows no acute changes from last tracing.  Remainder of lab work reviewed by me shows no leukocytosis, no significant metabolic derangements.  She is anemic and has a history of anemia.  She does not require transfusion at this time and vitals are stable though her anemia may be contributing to her  shortness of breath.  No evidence of pneumonia, pleural effusion.  Doubt ACS/MI.  She is PERC negative and I doubt PE.  No concern for pericarditis, myocarditis, dissection, or other life-threatening cardiopulmonary abnormalities.  I advised the patient to call the number on the back of her insurance card so that she may set up a follow-up appointment with a PCP who can help further evaluate her anemia and manage her migraines.  She has never taken prophylactic migraine medications.  No further emergent work-up required at this time.  She will follow-up with PCP for reevaluation of her symptoms.  Discussed strict ED return precautions. Pt verbalized understanding of and agreement with plan and is safe for discharge home at this time.   Final Clinical Impressions(s) /  ED Diagnoses   Final diagnoses:  Bad headache  Low hemoglobin  SOB (shortness of breath)    ED Discharge Orders    None       Bennye Alm 03/24/18 2317    Arby Barrette, MD 03/25/18 2234

## 2018-03-24 NOTE — ED Notes (Signed)
Pt O2 remained at 100% while ambulating

## 2018-03-24 NOTE — ED Triage Notes (Signed)
Patient here from home with complaints of SOB and headache x4 days. Nausea, vomiting.

## 2018-04-10 ENCOUNTER — Emergency Department (HOSPITAL_COMMUNITY)
Admission: EM | Admit: 2018-04-10 | Discharge: 2018-04-10 | Disposition: A | Discharge status: Home / Self Care | Payer: 59 | Attending: Emergency Medicine | Admitting: Emergency Medicine

## 2018-04-10 ENCOUNTER — Encounter (HOSPITAL_COMMUNITY): Payer: Self-pay

## 2018-04-10 DIAGNOSIS — Z5321 Procedure and treatment not carried out due to patient leaving prior to being seen by health care provider: Secondary | ICD-10-CM | POA: Insufficient documentation

## 2018-04-10 DIAGNOSIS — G43909 Migraine, unspecified, not intractable, without status migrainosus: Secondary | ICD-10-CM | POA: Insufficient documentation

## 2018-04-10 NOTE — ED Notes (Signed)
Called Pt in lobby for vital recheck no response in lobby x1.

## 2018-04-10 NOTE — ED Notes (Signed)
Called Pt in lobby, no response x2. 

## 2018-04-10 NOTE — ED Notes (Signed)
Called Pt in lobby, no response x3. Found Pt's stickers unattended. Do not see Pt in lobby.

## 2018-04-10 NOTE — ED Triage Notes (Signed)
She states she would rather "have a shot" and not receive the "IV cocktail".

## 2018-04-10 NOTE — ED Triage Notes (Signed)
She c/o migraine with three episodes of n/v since 0800 hours yesterday. She is in no distress.

## 2018-04-12 NOTE — ED Notes (Signed)
Follow up call made  No answer  04/12/18  1128  s Jennilyn Esteve rn

## 2018-11-26 ENCOUNTER — Other Ambulatory Visit: Payer: Self-pay

## 2018-11-26 ENCOUNTER — Emergency Department (HOSPITAL_COMMUNITY)
Admission: EM | Admit: 2018-11-26 | Discharge: 2018-11-26 | Disposition: A | Discharge status: Home / Self Care | Payer: 59 | Attending: Emergency Medicine | Admitting: Emergency Medicine

## 2018-11-26 DIAGNOSIS — B349 Viral infection, unspecified: Secondary | ICD-10-CM

## 2018-11-26 LAB — GROUP A STREP BY PCR: GROUP A STREP BY PCR: NOT DETECTED

## 2018-11-26 MED ORDER — ONDANSETRON 4 MG PO TBDP
4.0000 mg | ORAL_TABLET | Freq: Once | ORAL | Status: AC
  Administered 2018-11-26: 4 mg via ORAL
  Filled 2018-11-26: qty 1

## 2018-11-26 MED ORDER — ONDANSETRON HCL 4 MG PO TABS: 4.0000 mg | ORAL_TABLET | Freq: Four times a day (QID) | ORAL | 0 refills | Status: DC

## 2018-11-26 NOTE — ED Triage Notes (Signed)
Pt presents to ER complaining of generalized body aches and weakness x 1 week. Pt states this morning while driving to work she began to vomit x 4 episodes. Pt also states she has had diarrhea and nausea x 2. Pt endorses cough, sore throat, bilateral ear aches, and chest pain x 2 weeks. Pt states she had a fever on Saturday/sunday of 104.6 and was decreased with the use of tylenol. Pt is alert and oriented. Skin is warm and dry. Respirations are regular, even, and unlabored.    Past Medical History:  Diagnosis Date  . Anemia   . Migraines

## 2018-11-26 NOTE — ED Provider Notes (Signed)
[facility]-EMERGENCY DEPT Provider Note   CSN: [id number] Arrival date & time: 11/26/18  6629    History   Chief Complaint Chief Complaint  Patient presents with  . Weakness    HPI Caroline King is a 43 y.o. female.     The history is provided by the patient. No language interpreter was used.  Weakness     43 year old female presenting with flulike symptoms.  For the past 2 weeks she endorsed generalized body aches, throat irritation, nonproductive cough, and occasional posttussive emesis.  Symptoms moderate in severity.  She tries taking DayQuil or NyQuil at home without adequate relief.  She denies any recent travel or any recent sick contact.  She denies having any shortness of breath, dysuria, or rash.  For the past few days she also endorsed nausea vomiting diarrhea.  Does endorse some chest discomfort with coughing.  States she had a fever of 104 4 days ago which has resolved with Tylenol.  Past Medical History:  Diagnosis Date  . Anemia   . Migraines     There are no active problems to display for this patient.   Past Surgical History:  Procedure Laterality Date  . CESAREAN SECTION       OB History   No obstetric history on file.      Home Medications    Prior to Admission medications   Medication Sig Start Date End Date Taking? Authorizing Provider  acetaminophen (TYLENOL) 500 MG tablet Take 1,000 mg by mouth every 6 (six) hours as needed for headache.    [provider]  bacitracin ointment Apply 1 application topically 2 (two) times daily. Patient not taking: Reported on 03/24/2018 09/16/17   Derwood Kaplan, MD  ibuprofen (ADVIL,MOTRIN) 600 MG tablet Take 1 tablet (600 mg total) by mouth every 6 (six) hours as needed. Patient not taking: Reported on 03/24/2018 09/16/17   Derwood Kaplan, MD  meclizine (ANTIVERT) 25 MG tablet Take 1 tablet (25 mg total) by mouth 3 (three) times daily as needed for dizziness. 01/15/18   Terrilee Files, MD  potassium chloride SA (K-DUR,KLOR-CON) 20 MEQ tablet Take 1 tablet (20 mEq total) by mouth daily. Patient not taking: Reported on 03/24/2018 05/12/16   Pisciotta, Joni Reining, PA-C    Family History No family history on file.  Social History Social History   Tobacco Use  . Smoking status: Never Smoker  . Smokeless tobacco: Never Used  Substance Use Topics  . Alcohol use: No  . Drug use: No     Allergies   Patient has no known allergies.   Review of Systems Review of Systems  Neurological: Positive for weakness.  All other systems reviewed and are negative.    Physical Exam Updated Vital Signs BP 128/90 (BP Location: Left Arm)   Pulse 71   Temp 98.4 F (36.9 C) (Oral)   Resp 16   Ht 5\' 5"  (1.651 m)   Wt 90.7 kg   SpO2 97%   BMI 33.28 kg/m   Physical Exam Vitals signs and nursing note reviewed.  Constitutional:      General: She is not in acute distress.    Appearance: She is well-developed.  HENT:     Head: Atraumatic.     Right Ear: Tympanic membrane normal.     Left Ear: Tympanic membrane normal.     Nose: Nose normal.     Mouth/Throat:     Mouth: Mucous membranes are moist.     Pharynx:  Oropharynx is clear. No oropharyngeal exudate or posterior oropharyngeal erythema.  Eyes:     Conjunctiva/sclera: Conjunctivae normal.  Neck:     Musculoskeletal: Neck supple. No neck rigidity.  Cardiovascular:     Rate and Rhythm: Normal rate and regular rhythm.     Heart sounds: No murmur. No friction rub.  Pulmonary:     Effort: Pulmonary effort is normal.     Breath sounds: Normal breath sounds.  Chest:     Chest wall: No tenderness.  Abdominal:     Palpations: Abdomen is soft.     Tenderness: There is no abdominal tenderness.  Skin:    Findings: No rash.  Neurological:     Mental Status: She is alert.      ED Treatments / Results  Labs (all labs ordered are listed, but only abnormal results are displayed) Labs Reviewed  GROUP A STREP BY  PCR    EKG None  Radiology No results found.  Procedures Procedures (including critical care time)  Medications Ordered in ED Medications  ondansetron (ZOFRAN-ODT) disintegrating tablet 4 mg (4 mg Oral Given 11/26/18 0801)     Initial Impression / Assessment and Plan / ED Course  I have reviewed the triage vital signs and the nursing notes.  Pertinent labs & imaging results that were available during my care of the patient were reviewed by me and considered in my medical decision making (see chart for details).        BP 128/90 (BP Location: Left Arm)   Pulse 71   Temp 98.4 F (36.9 C) (Oral)   Resp 16   Ht 5\' 5"  (1.651 m)   Wt 90.7 kg   SpO2 97%   BMI 33.28 kg/m    Final Clinical Impressions(s) / ED Diagnoses   Final diagnoses:  Viral illness    ED Discharge Orders    None     7:56 AM Patient here with flulike symptoms.  She is overall well-appearing, low suspicion for COVID-19.  She has a fairly benign abdominal exam.  Rapid strep test ordered.  Antinausea medication given.  8:59 AM Strep test negative.  Pt tolerates PO.  Pt requesting for work note.  Note given.   Fayrene Helper, PA-C 11/26/18 8466    Jacalyn Lefevre, MD 11/26/18 1002

## 2019-03-28 ENCOUNTER — Other Ambulatory Visit: Payer: Self-pay

## 2019-03-28 ENCOUNTER — Encounter (HOSPITAL_COMMUNITY): Payer: Self-pay

## 2019-03-28 ENCOUNTER — Emergency Department (HOSPITAL_COMMUNITY)
Admission: EM | Admit: 2019-03-28 | Discharge: 2019-03-28 | Discharge status: Against Medical Advice | Payer: Self-pay | Attending: Emergency Medicine | Admitting: Emergency Medicine

## 2019-03-28 DIAGNOSIS — Z5321 Procedure and treatment not carried out due to patient leaving prior to being seen by health care provider: Secondary | ICD-10-CM | POA: Insufficient documentation

## 2019-03-28 NOTE — ED Triage Notes (Signed)
Pt states she migraine x 4 days . Topamax and sumatriptan have not provided any relief.

## 2019-03-28 NOTE — ED Triage Notes (Signed)
Pt called again from triage with no answer 

## 2019-06-24 ENCOUNTER — Telehealth (HOSPITAL_COMMUNITY): Payer: Self-pay

## 2019-06-24 NOTE — Telephone Encounter (Signed)
Telephoned patient at home number. Left message for to call and schedule appointment with [facility]

## 2019-11-04 ENCOUNTER — Other Ambulatory Visit: Payer: Self-pay | Admitting: Sports Medicine

## 2019-11-04 DIAGNOSIS — S62322D Displaced fracture of shaft of third metacarpal bone, right hand, subsequent encounter for fracture with routine healing: Secondary | ICD-10-CM

## 2019-11-14 ENCOUNTER — Ambulatory Visit
Admission: RE | Admit: 2019-11-14 | Discharge: 2019-11-14 | Disposition: A | Discharge status: Home / Self Care | Payer: Worker's Compensation | Source: Ambulatory Visit | Attending: Sports Medicine | Admitting: Sports Medicine

## 2019-11-14 DIAGNOSIS — S62322D Displaced fracture of shaft of third metacarpal bone, right hand, subsequent encounter for fracture with routine healing: Secondary | ICD-10-CM

## 2020-06-07 ENCOUNTER — Other Ambulatory Visit: Payer: Self-pay | Admitting: Orthopedic Surgery

## 2020-06-07 DIAGNOSIS — M79641 Pain in right hand: Secondary | ICD-10-CM

## 2020-06-21 ENCOUNTER — Ambulatory Visit
Admission: RE | Admit: 2020-06-21 | Discharge: 2020-06-21 | Disposition: A | Discharge status: Home / Self Care | Payer: Worker's Compensation | Source: Ambulatory Visit | Attending: Orthopedic Surgery | Admitting: Orthopedic Surgery

## 2020-06-21 DIAGNOSIS — M79641 Pain in right hand: Secondary | ICD-10-CM

## 2021-04-01 IMAGING — CT CT HAND*R* W/O CM
3 of 6 series · 12 of 36 positions shown, 14 images · non-contrast
Comparison: Multiple exams, including radiographs from 07/19/2019

CLINICAL DATA: Prior fracture of the third metacarpal, surveillance
of healing.

EXAM:
CT OF THE RIGHT HAND WITHOUT CONTRAST
TECHNIQUE: Multidetector CT imaging of the right hand was performed according
to the standard protocol. Multiplanar CT image reconstructions were
also generated.

[Series 7: wrist 1.50 br60 s3 axial bone hd fov · axial · 0.29mm/px · z∈[-751,-594]mm · 5 of 295 slices shown, 7 images]
[im 50/295  soft-tissue]
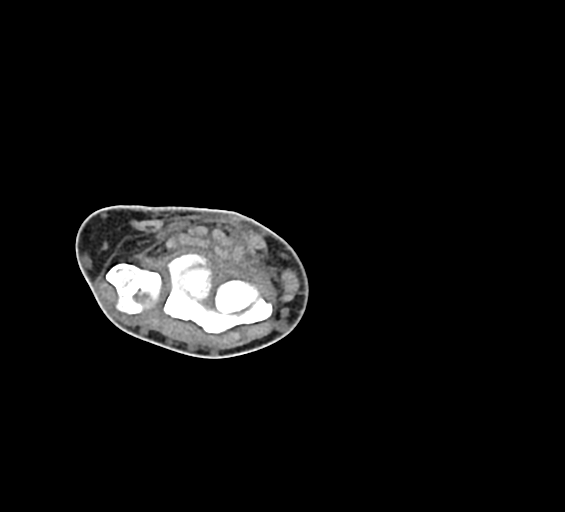
[im 50/295  bone]
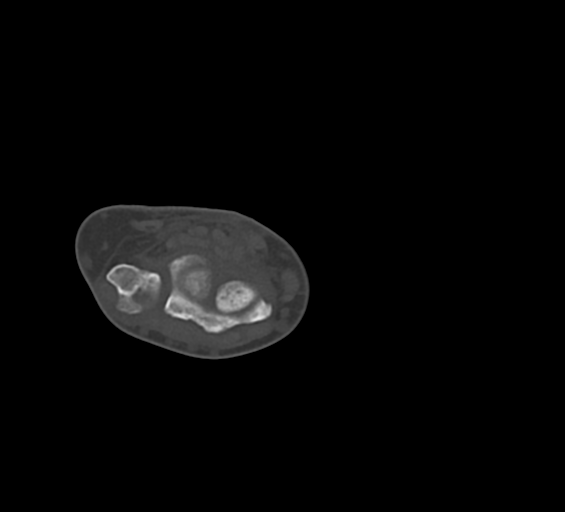
[im 99/295  bone]
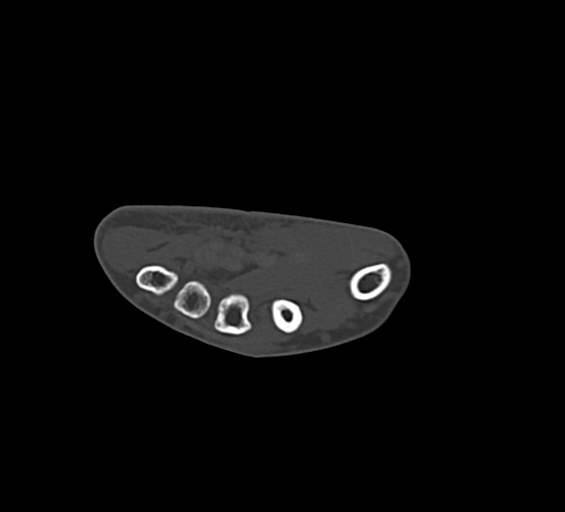
[im 148/295  bone]
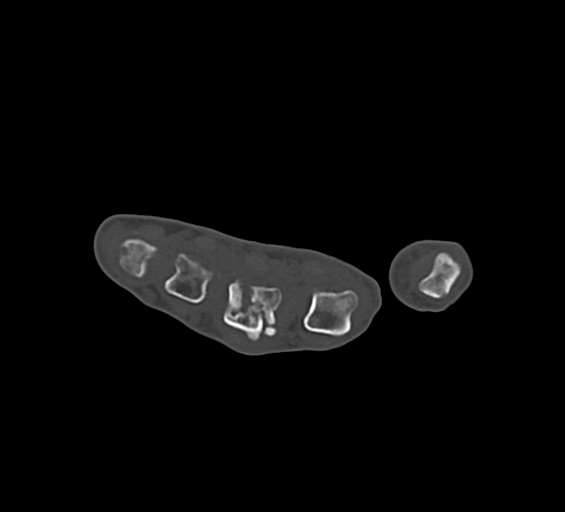
[im 197/295  bone]
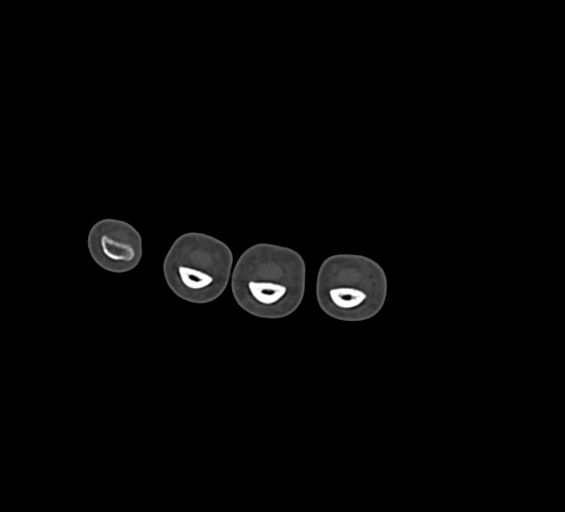
[im 246/295  soft-tissue]
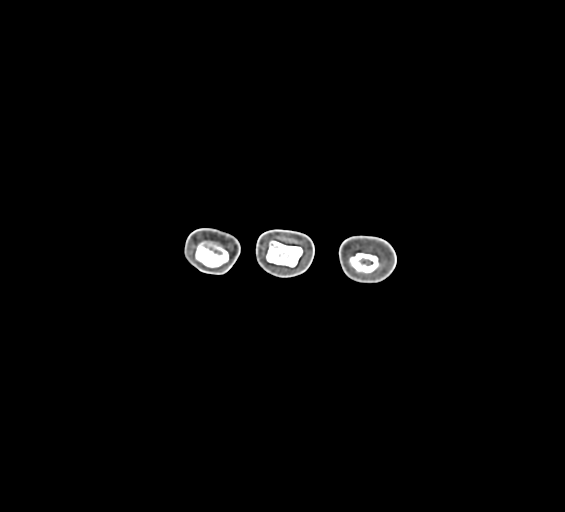
[im 246/295  bone]
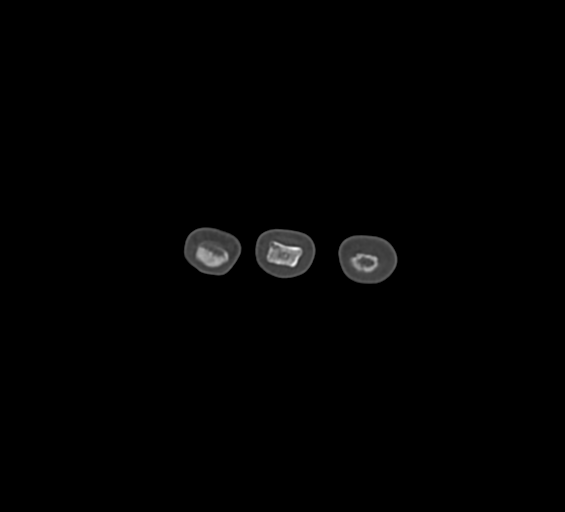

[Series 11: wrist 1.50 br60 s3 cor bone hd fov · coronal · 0.32mm/px · 1 of 186 slices shown]
[im 93/186  bone]
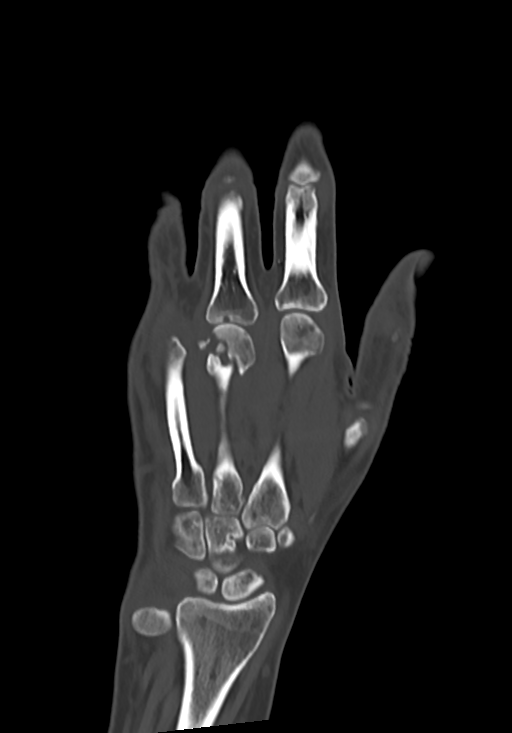

[Series 15: wrist 1.50 br60 s3 sag bone hd fov · sagittal · 0.29mm/px · 6 of 205 slices shown]
[im 35/205  bone]
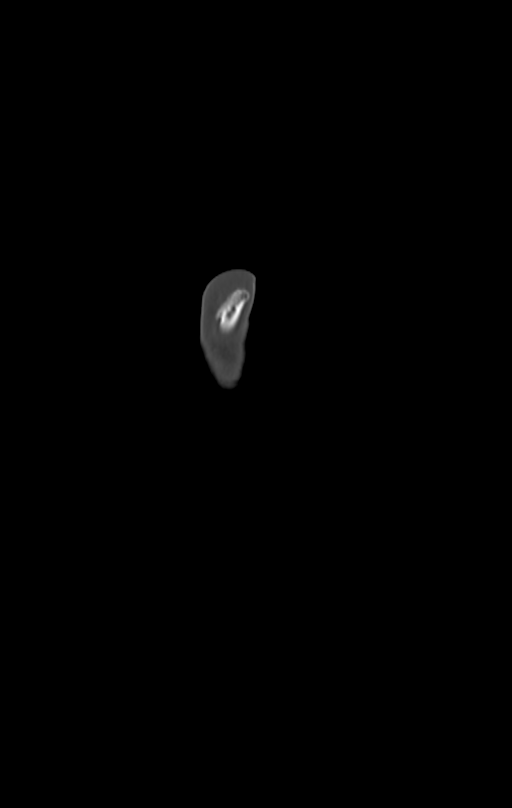
[im 49/205  soft-tissue]
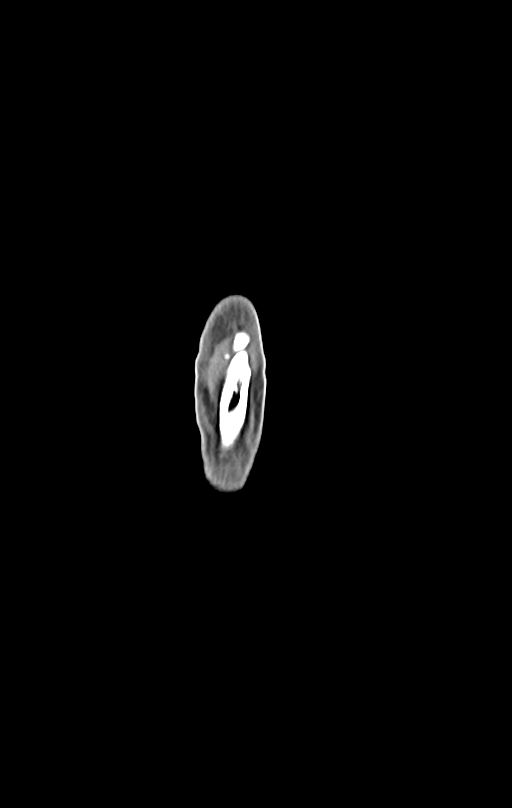
[im 69/205  bone]
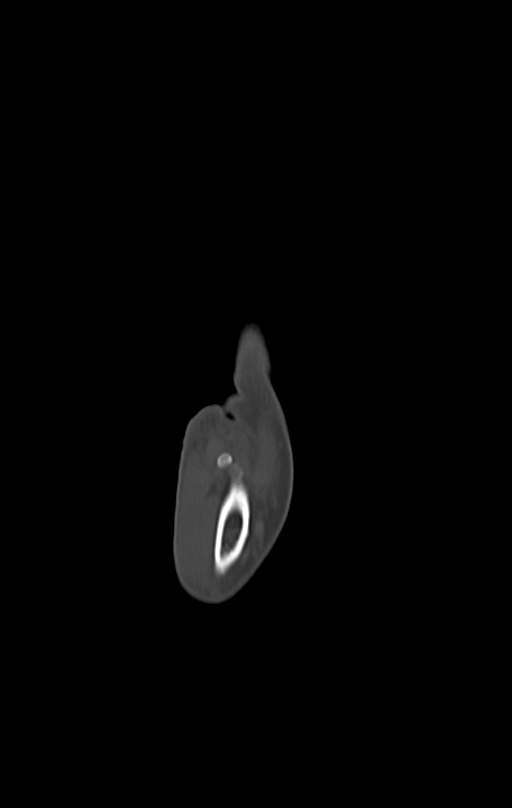
[im 103/205  bone]
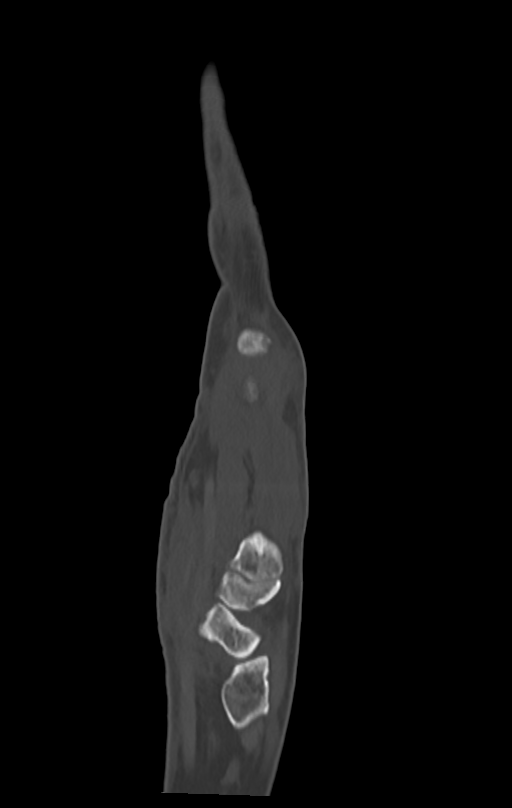
[im 137/205  bone]
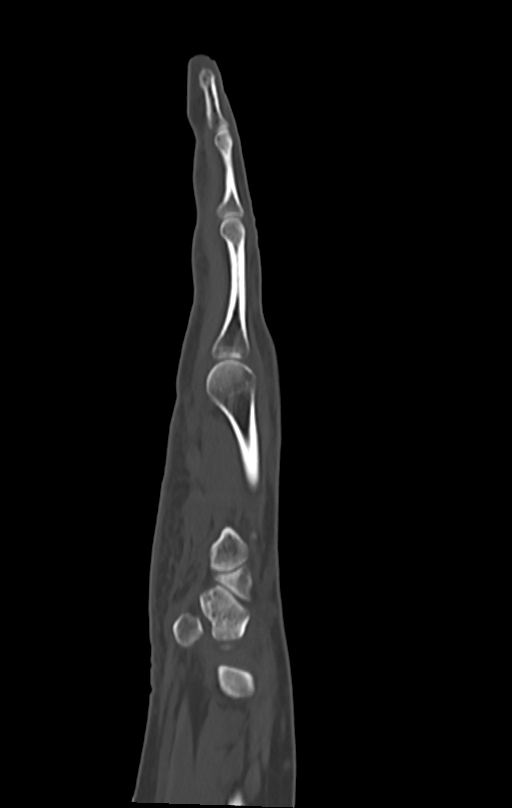
[im 171/205  bone]
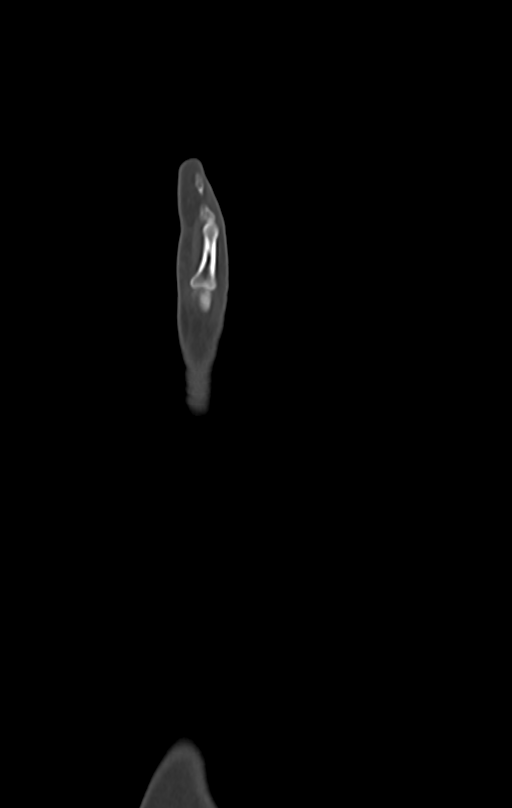

[12 of 36 positions shown; findings below may reference images not displayed]

FINDINGS: Bones/Joint/Cartilage

The previous K-wires have been removed. The oblique comminuted
fracture of the distal third metacarpal metaphysis extending into
the metacarpal head noted. The largest metatarsal head fragment is
along the radial side and in general demonstrate significantly
improved positioning and alignment compared to the preoperative
radiographs of 07/19/2019, with only about 0.4 cm of bony overlap
and slight radial rotation of the distal surface suspected. This
fragment articulates with the base of the proximal phalanx, with
some loss of articular height and small articular lesions observed
which are likely degenerative in the base of the proximal phalanx.

Smaller ulnar-sided fragments are observed.

There has been no appreciable bony bridging between the radial
fragment, the ulnar fragments, and the main proximal fragment. There
is a suggestion of some early cortication along distal dominant
radial fracture fragment fracture plane margin for example on image
96/11 suggesting early nonunion.

No current gas in the soft tissues.

Ligaments

Suboptimally assessed by CT.

Muscles and Tendons

Grossly unremarkable

Soft tissues

Soft tissue swelling in the vicinity of the distal third metatarsal
fracture.

On image 184/7, there appears to be a small potentially metal linear
foreign body in the subcutaneous tissues along the ulnar side of the
proximal metaphysis of the proximal phalanx second digit. This was
not visible on prior hand radiographs from 07/19/2019, but is
appreciable on the scanogram image, and may have been intervally
acquired.
IMPRESSION: 1. Distal oblique third metatarsal fracture with moderate
comminution and early nonunion. The largest third metatarsal head
fragment is along the radial side of the metacarpal head, with only
about 0.4 cm of bony overlap and slight radial rotation of the
distal surface, but much improved compared to the preoperative
appearance. Adjacent soft tissue swelling noted.
2. 0.5 cm potentially metal linear foreign body in the subcutaneous
tissues along the ulnar side of the proximal metaphysis of the
second digit. This was not visible on prior hand radiographs from
07/19/2019, and has presumably been intervally acquired.

## 2021-11-26 DIAGNOSIS — R0789 Other chest pain: Secondary | ICD-10-CM | POA: Diagnosis not present

## 2021-11-26 DIAGNOSIS — N920 Excessive and frequent menstruation with regular cycle: Secondary | ICD-10-CM | POA: Diagnosis not present

## 2021-11-26 DIAGNOSIS — R638 Other symptoms and signs concerning food and fluid intake: Secondary | ICD-10-CM | POA: Diagnosis not present

## 2021-11-26 DIAGNOSIS — G4733 Obstructive sleep apnea (adult) (pediatric): Secondary | ICD-10-CM | POA: Diagnosis not present

## 2021-12-10 NOTE — Progress Notes (Signed)
? ?ID:  Caroline King, DOB [birth year], MRN [id number] ? ?PCP:  Patient, No Pcp Per (Inactive)  ?Cardiologist:  Rex Kras, DO, [facility] (established care 12/11/2021) ?Former Cardiology Providers: Elliot Gurney PA-C ([facility]). ? ?REASON FOR CONSULT: Chest pain  ? ?REQUESTING PHYSICIAN:  ?Shanon Rosser, PA-C ?[address] ?[place],  [place] [id number] ? ?Chief Complaint  ?Patient presents with  ? Chest Pain  ? New Patient (Initial Visit)  ? Shortness of Breath  ? Loss of Consciousness  ? ? ?HPI  ?Caroline King is a 46 y.o. African-American female whose past medical history and cardiovascular risk factors include: Family history of premature CAD, iron deficiency anemia, migraines, obesity due to excess calories. ? ?She is referred to the office at the request of Long, Caroline Reaper, PA-C for evaluation of chest pain. ? ?Chest pain: ?Patient states that she has been experiencing left sided chest tightness since 2022.  It occurs 3-4 times per week, describes it as a tightness like sensation, lasting for 1 to 2 minutes, at times brought on by effort related activities, not resolved with rest, usually self-limited.  At times the symptoms improved with taking deep breaths.  Associated symptoms include shortness of breath. ? ?Syncope: ?About 2 or 3 months ago patient states that she was at her work and standing for approximately 45 minutes and had a sudden onset of feeling hot, diaphoretic, vision change described as tunnel vision, passed out for approximately 3 minutes.  Event witnessed by her coworkers.  Cold compresses were applied.  She regained consciousness.  She did not go to the hospital for further evaluation.  And no recurrence of symptoms. ? ?Patient does have iron deficiency anemia due to menorrhagia given her uterine fibroids.  Hemoglobin noted to be 7.5 g/dL and iron indices noted below. ? ?Patient denies being evaluated for thyroid disease in the past.  She does carry history  of sinus bradycardia. ? ?Mother passed at the age of 42 due to myocardial infarction. ? 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 ? ?FUNCTIONAL STATUS: ?No structured exercise program or daily routine. But walks 17,000 - 20,000 steps a day at work.  ? ?ALLERGIES: ?No Known Allergies ? ?MEDICATION LIST PRIOR TO VISIT: ?Current Meds  ?Medication Sig  ? acetaminophen (TYLENOL) 500 MG tablet Take 1,000 mg by mouth every 6 (six) hours as needed for headache.  ? Iron-FA-B Cmp-C-Biot-Probiotic (FUSION PLUS) CAPS Take 1 capsule by mouth daily.  ? meclizine (ANTIVERT) 25 MG tablet Take 1 tablet (25 mg total) by mouth 3 (three) times daily as needed for dizziness.  ? ondansetron (ZOFRAN) 4 MG tablet Take 1 tablet (4 mg total) by mouth every 6 (six) hours.  ? SUMAtriptan (IMITREX) 50 MG tablet Take 1 tablet by mouth daily.  ?  ? ?PAST MEDICAL HISTORY: ?Past Medical History:  ?Diagnosis Date  ? Anemia   ? Migraines   ? ? ?PAST SURGICAL HISTORY: ?Past Surgical History:  ?Procedure Laterality Date  ? CESAREAN SECTION    ? TUBAL LIGATION    ? ? ?FAMILY HISTORY: ?The patient family history includes Cancer in her mother; Heart attack in her mother; Stomach cancer in her father. ? ?SOCIAL HISTORY:  ?The patient  reports that she has never smoked. She has never used smokeless tobacco. She reports that she does not drink alcohol and does not use drugs. ? ?REVIEW OF SYSTEMS: ?Review of Systems  ?Cardiovascular:  Positive for chest pain,  dyspnea on exertion and syncope (last episode 3 month ago). Negative for leg swelling and palpitations.  ?Respiratory:  Positive for shortness of breath.   ?Neurological:  Positive for dizziness and light-headedness.  ? ?PHYSICAL EXAM: ? ?  12/11/2021  ?  8:45 AM 03/28/2019  ?  3:43 PM 03/28/2019  ?  3:42 PM  ?Vitals with BMI  ?Height 5' 5"     ?Weight 209 lbs 13 oz  216 lbs  ?BMI 34.91    ?Systolic 330 076   ?Diastolic 80 98   ?Pulse 68 80   ? ?Orthostatic VS for the  past 72 hrs (Last 3 readings): ? Orthostatic BP Patient Position BP Location Cuff Size Orthostatic Pulse  ?12/11/21 0853 110/72 Standing Left Arm Large 65  ?12/11/21 0852 111/79 Sitting Left Arm Large 64  ?12/11/21 0850 108/71 Supine Left Arm Large 63  ? ?CONSTITUTIONAL: Well-developed and well-nourished. No acute distress.  ?SKIN: Skin is warm and dry. No rash noted. No cyanosis. No pallor. No jaundice ?HEAD: Normocephalic and atraumatic.  ?EYES: No scleral icterus ?MOUTH/THROAT: Moist oral membranes.  ?NECK: No JVD present. No thyromegaly noted. No carotid bruits  ?LYMPHATIC: No visible cervical adenopathy.  ?CHEST Normal respiratory effort. No intercostal retractions  ?LUNGS: Clear to auscultation bilaterally.  No stridor. No wheezes. No rales.  ?CARDIOVASCULAR: Regular rate and rhythm, positive S1-S2, no murmurs rubs or gallops appreciated. ?ABDOMINAL:  No apparent ascites.  ?EXTREMITIES: No peripheral edema, warm to touch, 2+ bilateral DP and PT pulses ?HEMATOLOGIC: No significant bruising ?NEUROLOGIC: Oriented to person, place, and time. Nonfocal. Normal muscle tone.  ?PSYCHIATRIC: Normal mood and affect. Normal behavior. Cooperative ? ?CARDIAC DATABASE: ?EKG: ?12/11/2021: Sinus bradycardia, 59 bpm, nonspecific T wave abnormality.   ? ?Echocardiogram: ?04/30/2021 [facility] records available in Care Everywhere: ?Per report LVEF 55 to 60%, normal wall motion, normal thickness, right ventricular size and function normal, no pericardial effusion, no significant valvular stenosis or regurgitation. ? ?Stress Testing: ?No results found for this or any previous visit from the past 1095 days. ? ?Heart Catheterization: ?None ? ?LABORATORY DATA: ?External Labs:  ?Date Collected: 11/27/2021 , information obtained by referring physician ?Potassium: 4.5 ?Creatinine 0.80 mg/dL. ?eGFR: 93 mL/min per 1.73 m? ?Hemoglobin: 7.5 g/dL and hematocrit: 26.9 % ?AST: 18 , ALT: 8 , alkaline  phosphatase: 74  ?TSH: 1.13   ?Total iron 11 (normal range 40-1 90) ?Iron binding capacity 492 (normal 250-450). ?Iron saturation 2% (normal range 16-45%) ?Ferritin 2 (normal range 16-232) ? ?IMPRESSION: ? ?  ICD-10-CM   ?1. Precordial pain  R07.2 EKG 12-Lead  ?  Lipid Panel With LDL/HDL Ratio  ?  LDL cholesterol, direct  ?  CMP14+EGFR  ?  PCV CARDIAC STRESS TEST  ?  CT CARDIAC SCORING ([facility] LOCATIONS ONLY)  ?  ?2. Shortness of breath  R06.02 B Nat Peptide  ?  ?3. Vasovagal syncope  R55 TSH  ?  ?4. Bradycardia  R00.1 TSH  ?  ?5. Iron deficiency anemia due to chronic blood loss  D50.0   ?  ?6. Family history of premature CAD  Z82.49 Lipid Panel With LDL/HDL Ratio  ?  LDL cholesterol, direct  ?  CMP14+EGFR  ?  CT CARDIAC SCORING ([facility] LOCATIONS ONLY)  ?  ?  ? ?RECOMMENDATIONS: ?[place] Nolton is a 46 y.o. African-American female whose past medical history and cardiac risk factors include: Family history of premature CAD, iron deficiency anemia, migraines, obesity due to excess calories. ? ?Precordial pain ?Symptoms  likely noncardiac. ?Recently had an echocardiogram in August 2022 at [facility] records independently reviewed in [place] noted above for further reference. ?Plan GXT to evaluate for functional status, chronotropic competence, and exercise-induced ischemia. ?Coronary artery calcium score for further risk stratification. ?Check CMP, fasting lipid profile, direct LDL for further restratification ? ?Shortness of breath ?I suspect her shortness of breath is likely due to her underlying anemia with a hemoglobin of 7.5 g/dL.  And iron deficiency. ?Check BNP ?Euvolemic on physical examination. ? ?Vasovagal syncope ?Her episode of syncope approximately 3 months ago likely due to vasovagal etiology based on her symptoms. ?No recurrence. ?Currently being treated for iron deficiency anemia. ?If she has recurrent syncope she is asked to seek medical attention by going to the  closest ED via EMS for further evaluation and management.  Driving restrictions reviewed.  ? ?Bradycardia ?Asymptomatic. ?Check TSH and additional labs. ?We will evaluate chronotropic competence as part of a [place]

## 2021-12-11 ENCOUNTER — Ambulatory Visit: Payer: 59 | Admitting: Cardiology

## 2021-12-11 ENCOUNTER — Encounter: Payer: Self-pay | Admitting: Cardiology

## 2021-12-11 VITALS — BP 114/80 | HR 68 | Temp 97.9°F | Resp 16 | Ht 65.0 in | Wt 209.8 lb

## 2021-12-11 DIAGNOSIS — Z8249 Family history of ischemic heart disease and other diseases of the circulatory system: Secondary | ICD-10-CM | POA: Diagnosis not present

## 2021-12-11 DIAGNOSIS — D500 Iron deficiency anemia secondary to blood loss (chronic): Secondary | ICD-10-CM | POA: Diagnosis not present

## 2021-12-11 DIAGNOSIS — R55 Syncope and collapse: Secondary | ICD-10-CM

## 2021-12-11 DIAGNOSIS — R072 Precordial pain: Secondary | ICD-10-CM | POA: Diagnosis not present

## 2021-12-11 DIAGNOSIS — R001 Bradycardia, unspecified: Secondary | ICD-10-CM | POA: Diagnosis not present

## 2021-12-11 DIAGNOSIS — R0602 Shortness of breath: Secondary | ICD-10-CM | POA: Diagnosis not present

## 2021-12-13 DIAGNOSIS — B353 Tinea pedis: Secondary | ICD-10-CM | POA: Diagnosis not present

## 2021-12-13 DIAGNOSIS — L299 Pruritus, unspecified: Secondary | ICD-10-CM | POA: Diagnosis not present

## 2021-12-13 DIAGNOSIS — D509 Iron deficiency anemia, unspecified: Secondary | ICD-10-CM | POA: Diagnosis not present

## 2022-01-16 ENCOUNTER — Other Ambulatory Visit: Payer: Self-pay

## 2022-01-23 ENCOUNTER — Ambulatory Visit: Payer: 59 | Admitting: Cardiology

## 2022-03-23 ENCOUNTER — Emergency Department: Payer: Medicaid Other

## 2022-03-23 ENCOUNTER — Emergency Department
Admission: EM | Admit: 2022-03-23 | Discharge: 2022-03-23 | Disposition: A | Discharge status: Home / Self Care | Payer: Medicaid Other | Attending: Emergency Medicine | Admitting: Emergency Medicine

## 2022-03-23 ENCOUNTER — Encounter: Payer: Self-pay | Admitting: Emergency Medicine

## 2022-03-23 ENCOUNTER — Other Ambulatory Visit: Payer: Self-pay

## 2022-03-23 DIAGNOSIS — S022XXA Fracture of nasal bones, initial encounter for closed fracture: Secondary | ICD-10-CM

## 2022-03-23 DIAGNOSIS — S0083XA Contusion of other part of head, initial encounter: Secondary | ICD-10-CM

## 2022-03-23 MED ORDER — ACETAMINOPHEN 500 MG PO TABS
1000.0000 mg | ORAL_TABLET | Freq: Once | ORAL | Status: AC
  Administered 2022-03-23: 1000 mg via ORAL
  Filled 2022-03-23: qty 2

## 2022-03-23 MED ORDER — KETOROLAC TROMETHAMINE 30 MG/ML IJ SOLN
30.0000 mg | Freq: Once | INTRAMUSCULAR | Status: AC
  Administered 2022-03-23: 30 mg via INTRAMUSCULAR
  Filled 2022-03-23: qty 1

## 2022-03-23 MED ORDER — OXYCODONE HCL 5 MG PO TABS
5.0000 mg | ORAL_TABLET | Freq: Once | ORAL | Status: AC
  Administered 2022-03-23: 5 mg via ORAL
  Filled 2022-03-23: qty 1
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
## 2022-03-23 NOTE — Discharge Instructions (Addendum)
Please take Tylenol and ibuprofen/Advil for your pain.  It is safe to take them together, or to alternate them every few hours.  Take up to 1000mg of Tylenol at a time, up to 4 times per day.  Do not take more than 4000 mg of Tylenol in 24 hours.  For ibuprofen, take 400-600 mg, 3 - 4 times per day.  

## 2022-06-03 ENCOUNTER — Emergency Department (HOSPITAL_COMMUNITY): Payer: Medicaid Other

## 2022-06-03 ENCOUNTER — Emergency Department (HOSPITAL_COMMUNITY): Payer: Self-pay

## 2022-06-03 ENCOUNTER — Emergency Department (HOSPITAL_COMMUNITY)
Admission: EM | Admit: 2022-06-03 | Discharge: 2022-06-03 | Disposition: A | Discharge status: Home / Self Care | Payer: Self-pay | Attending: Emergency Medicine | Admitting: Emergency Medicine

## 2022-06-03 ENCOUNTER — Other Ambulatory Visit: Payer: Self-pay

## 2022-06-03 ENCOUNTER — Encounter (HOSPITAL_COMMUNITY): Payer: Self-pay | Admitting: *Deleted

## 2022-06-03 DIAGNOSIS — R55 Syncope and collapse: Secondary | ICD-10-CM | POA: Insufficient documentation

## 2022-06-03 DIAGNOSIS — H53149 Visual discomfort, unspecified: Secondary | ICD-10-CM | POA: Insufficient documentation

## 2022-06-03 DIAGNOSIS — R519 Headache, unspecified: Secondary | ICD-10-CM | POA: Insufficient documentation

## 2022-06-03 DIAGNOSIS — R112 Nausea with vomiting, unspecified: Secondary | ICD-10-CM | POA: Insufficient documentation

## 2022-06-03 LAB — CBC
HCT: 39.9 % (ref 36.0–46.0)
Hemoglobin: 12.9 g/dL (ref 12.0–15.0)
MCH: 26.7 pg (ref 26.0–34.0)
MCHC: 32.3 g/dL (ref 30.0–36.0)
MCV: 82.6 fL (ref 80.0–100.0)
Platelets: 365 10*3/uL (ref 150–400)
RBC: 4.83 MIL/uL (ref 3.87–5.11)
RDW: 15.8 % — ABNORMAL HIGH (ref 11.5–15.5)
WBC: 5 10*3/uL (ref 4.0–10.5)
nRBC: 0 % (ref 0.0–0.2)

## 2022-06-03 LAB — COMPREHENSIVE METABOLIC PANEL
ALT: 12 U/L (ref 0–44)
AST: 17 U/L (ref 15–41)
Albumin: 3.4 g/dL — ABNORMAL LOW (ref 3.5–5.0)
Alkaline Phosphatase: 61 U/L (ref 38–126)
Anion gap: 7 (ref 5–15)
BUN: 9 mg/dL (ref 6–20)
CO2: 25 mmol/L (ref 22–32)
Calcium: 9.3 mg/dL (ref 8.9–10.3)
Chloride: 105 mmol/L (ref 98–111)
Creatinine, Ser: 0.82 mg/dL (ref 0.44–1.00)
GFR, Estimated: >60 mL/min (ref >60)
Glucose, Bld: 81 mg/dL (ref 70–99)
Potassium: 4.5 mmol/L (ref 3.5–5.1)
Sodium: 137 mmol/L (ref 135–145)
Total Bilirubin: 0.7 mg/dL (ref 0.3–1.2)
Total Protein: 7.9 g/dL (ref 6.5–8.1)

## 2022-06-03 LAB — I-STAT BETA HCG BLOOD, ED (MC, WL, AP ONLY): I-stat hCG, quantitative: <5 m[IU]/mL (ref <5)

## 2022-06-03 MED ORDER — OXYCODONE-ACETAMINOPHEN 5-325 MG PO TABS: 1.0000 | ORAL_TABLET | ORAL | Status: DC | PRN

## 2022-06-03 MED ORDER — GADOPICLENOL 0.5 MMOL/ML IV SOLN
10.0000 mL | Freq: Once | INTRAVENOUS | Status: AC | PRN
  Administered 2022-06-03: 10 mL via INTRAVENOUS

## 2022-06-03 MED ORDER — PROCHLORPERAZINE EDISYLATE 10 MG/2ML IJ SOLN
10.0000 mg | Freq: Once | INTRAMUSCULAR | Status: AC
  Administered 2022-06-03: 10 mg via INTRAVENOUS
  Filled 2022-06-03: qty 2

## 2022-06-03 MED ORDER — SODIUM CHLORIDE 0.9 % IV BOLUS
1000.0000 mL | Freq: Once | INTRAVENOUS | Status: AC
  Administered 2022-06-03: 1000 mL via INTRAVENOUS

## 2022-06-03 MED ORDER — KETOROLAC TROMETHAMINE 15 MG/ML IJ SOLN
15.0000 mg | Freq: Once | INTRAMUSCULAR | Status: AC
  Administered 2022-06-03: 15 mg via INTRAVENOUS
  Filled 2022-06-03: qty 1

## 2022-06-03 MED ORDER — DIPHENHYDRAMINE HCL 50 MG/ML IJ SOLN
50.0000 mg | Freq: Once | INTRAMUSCULAR | Status: AC
  Administered 2022-06-03: 50 mg via INTRAVENOUS
  Filled 2022-06-03: qty 1

## 2022-06-03 MED ORDER — IBUPROFEN 400 MG PO TABS
400.0000 mg | ORAL_TABLET | Freq: Once | ORAL | Status: AC | PRN
  Administered 2022-06-03: 400 mg via ORAL
  Filled 2022-06-03: qty 1
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
## 2022-06-03 NOTE — ED Notes (Signed)
Pt back from MRI at this time

## 2022-06-03 NOTE — ED Triage Notes (Signed)
Pt has had headache since Friday, reports nose bleed and reports left eye issue.  Reports she had a syncopal event but does not remember on Sunday.  Pt reports that she has a lot pressure on left side of head.  Reports no visual change

## 2022-06-03 NOTE — ED Notes (Signed)
Pt transported to MRI 

## 2022-06-03 NOTE — ED Provider Notes (Signed)
[facility] EMERGENCY DEPARTMENT Provider Note   CSN: [id number] Arrival date & time: 06/03/22  5093     History  Chief Complaint  Patient presents with   Headache    Caroline King is a 46 y.o. female.  The history is provided by the patient, the spouse and medical records. No language interpreter was used.  Headache Pain location:  L temporal and frontal Quality:  Dull Radiates to:  Does not radiate Severity currently:  8/10 Severity at highest:  10/10 Onset quality:  Gradual Duration:  6 days Timing:  Constant Progression:  Waxing and waning Chronicity:  New Context: bright light   Relieved by:  Nothing Worsened by:  Light Ineffective treatments:  None tried Associated symptoms: nausea, photophobia, syncope (vs seizure) and vomiting   Associated symptoms: no abdominal pain, no back pain, no blurred vision, no congestion, no cough, no diarrhea, no dizziness, no fatigue, no fever, no loss of balance, no near-syncope, no neck pain, no neck stiffness, no paresthesias, no URI, no visual change and no weakness        Home Medications Prior to Admission medications   Medication Sig Start Date End Date Taking? Authorizing Provider  acetaminophen (TYLENOL) 500 MG tablet Take 1,000 mg by mouth every 6 (six) hours as needed for headache.    [provider]  Iron-FA-B Cmp-C-Biot-Probiotic (FUSION PLUS) CAPS Take 1 capsule by mouth daily. 12/04/21   [provider]  meclizine (ANTIVERT) 25 MG tablet Take 1 tablet (25 mg total) by mouth 3 (three) times daily as needed for dizziness. 01/15/18   Terrilee Files, MD  ondansetron (ZOFRAN) 4 MG tablet Take 1 tablet (4 mg total) by mouth every 6 (six) hours. 11/26/18   Fayrene Helper, PA-C  SUMAtriptan (IMITREX) 50 MG tablet Take 1 tablet by mouth daily. 03/25/21   [provider]      Allergies    Patient has no known allergies.    Review of Systems   Review of Systems  Constitutional:   Negative for chills, fatigue and fever.  HENT:  Negative for congestion.   Eyes:  Positive for photophobia. Negative for blurred vision.  Respiratory:  Negative for cough, chest tightness, shortness of breath and wheezing.   Cardiovascular:  Positive for syncope (vs seizure). Negative for chest pain, palpitations and near-syncope.  Gastrointestinal:  Positive for nausea and vomiting. Negative for abdominal pain, constipation and diarrhea.  Genitourinary:  Negative for dysuria and flank pain.  Musculoskeletal:  Negative for back pain, neck pain and neck stiffness.  Skin:  Negative for rash and wound.  Neurological:  Positive for syncope (vs seizure), light-headedness and headaches. Negative for dizziness, speech difficulty, weakness, paresthesias and loss of balance.  Psychiatric/Behavioral:  Negative for confusion.   All other systems reviewed and are negative.   Physical Exam Updated Vital Signs BP (!) 124/95 (BP Location: Left Arm)   Pulse (!) 54   Temp 98.1 F (36.7 C) (Oral)   Resp 16   LMP 06/03/2022 Comment: IUD  SpO2 97%  Physical Exam Vitals and nursing note reviewed.  Constitutional:      General: She is not in acute distress.    Appearance: She is well-developed. She is not ill-appearing, toxic-appearing or diaphoretic.  HENT:     Head: Normocephalic and atraumatic.     Nose: No congestion or rhinorrhea.     Mouth/Throat:     Mouth: Mucous membranes are dry.     Pharynx: No oropharyngeal exudate or  posterior oropharyngeal erythema.  Eyes:     Extraocular Movements: Extraocular movements intact.     Conjunctiva/sclera: Conjunctivae normal.     Pupils: Pupils are equal, round, and reactive to light.  Neck:     Vascular: No carotid bruit.  Cardiovascular:     Rate and Rhythm: Normal rate and regular rhythm.     Heart sounds: No murmur heard. Pulmonary:     Effort: Pulmonary effort is normal. No respiratory distress.     Breath sounds: Normal breath sounds. No  wheezing, rhonchi or rales.  Chest:     Chest wall: No tenderness.  Abdominal:     Palpations: Abdomen is soft.     Tenderness: There is no abdominal tenderness. There is no right CVA tenderness, left CVA tenderness, guarding or rebound.  Musculoskeletal:        General: No swelling or tenderness.     Cervical back: Neck supple. No tenderness.     Right lower leg: No edema.     Left lower leg: No edema.  Skin:    General: Skin is warm and dry.     Capillary Refill: Capillary refill takes less than 2 seconds.     Findings: No erythema or rash.  Neurological:     General: No focal deficit present.     Mental Status: She is alert. Mental status is at baseline.     Sensory: No sensory deficit.     Motor: No weakness.     Coordination: Coordination normal.  Psychiatric:        Mood and Affect: Mood normal.     ED Results / Procedures / Treatments   Labs (all labs ordered are listed, but only abnormal results are displayed) Labs Reviewed  CBC - Abnormal; Notable for the following components:      Result Value   RDW 15.8 (*)    All other components within normal limits  COMPREHENSIVE METABOLIC PANEL - Abnormal; Notable for the following components:   Albumin 3.4 (*)    All other components within normal limits  I-STAT BETA HCG BLOOD, ED ([facility], [facility], [facility] ONLY)    EKG EKG Interpretation  Date/Time:  Tuesday June 03 2022 10:18:20 EDT Ventricular Rate:  60 PR Interval:  186 QRS Duration: 86 QT Interval:  426 QTC Calculation: 426 R Axis:   -3 Text Interpretation: Normal sinus rhythm Cannot rule out Anterior infarct , age undetermined Abnormal ECG When compared with ECG of 24-Mar-2018 14:45, PREVIOUS ECG IS PRESENT when compared to prior, similar appearance. NO STEMI Confirmed by Theda Belfast ([id number]) on 06/03/2022 2:25:22 PM  Radiology CT HEAD WO CONTRAST ( )  Result Date: 06/03/2022 CLINICAL DATA:  Head trauma, moderate to severe. EXAM: CT HEAD WITHOUT CONTRAST  TECHNIQUE: Contiguous axial images were obtained from the base of the skull through the vertex without intravenous contrast. RADIATION DOSE REDUCTION: This exam was performed according to the departmental dose-optimization program which includes automated exposure control, adjustment of the mA and/or kV according to patient size and/or use of iterative reconstruction technique. COMPARISON:  March 23, 2022 FINDINGS: Brain: No evidence of acute infarction, hemorrhage, hydrocephalus, extra-axial collection or mass lesion/mass effect. Vascular: No hyperdense vessel or unexpected calcification. Skull: Normal. Negative for fracture or focal lesion. 5 mm extra-axial left parietal calcification. Sinuses/Orbits: No acute finding. Other: None. IMPRESSION: 1. No acute intracranial abnormality. 2. 5 mm extra-axial left parietal calcification may represent calcified meningioma or a benign skull calcification. Electronically Signed   By: Ulanda Edison.D.  On: 06/03/2022 11:41    Procedures Procedures    Medications Ordered in ED Medications  ibuprofen (ADVIL) tablet 400 mg (400 mg Oral Given 06/03/22 1328)  prochlorperazine (COMPAZINE) injection 10 mg (10 mg Intravenous Given 06/03/22 1547)  diphenhydrAMINE (BENADRYL) injection 50 mg (50 mg Intravenous Given 06/03/22 1549)  sodium chloride 0.9 % bolus 1,000 mL (1,000 mLs Intravenous New Bag/Given 06/03/22 1542)  ketorolac (TORADOL) 15 MG/ML injection 15 mg (15 mg Intravenous Given 06/03/22 1543)    ED Course/ Medical Decision Making/ A&P                           Medical Decision Making Amount and/or Complexity of Data Reviewed Radiology: ordered.  Risk Prescription drug management.    Latifah Padin is a 46 y.o. female with a past medical history significant for migraines who presents with 6 days of headache and an episode of loss of consciousness several days ago.  According to patient and spouse, she has not had headaches for many months and then  on Thursday started having headache primarily left side of her head.  She reports that it did feel strange that the discomfort was rating down the whole left side of her body although she did not report any numbness or acute weakness.  She denied vision changes or speech abnormalities and denied any true room spinning dizziness.  She did report some lightheadedness at times with it.  She said that on Sunday, while getting ready to get in bed she completely lost consciousness and had a episode of shaking for approximately 1 minute.  She did not lose bowel or bladder control and did not bite her tongue however family was concerned about a seizure.  She is never had seizures before.  She denies significant alcohol use or drug use and denies any previous trauma.  Denies any focal numbness, tingling, or weakness being persistent.  She denies other complaints on arrival.  She does show a in the past she has had an episode of syncope but was not found to have a cause at that time.  She does describe associated photophobia.  Currently she is reporting headache is 8 out of 10 in severity.  It does not go into her neck.  She has been having nausea and vomiting with it.  On my exam, I do not appreciate any focal deficits with intact sensation, strength, and pulses in extremities.  Normal finger-nose-finger testing.  Symmetric smile.  Clear speech.  She did have some mild weakness and tingling in her right hand which she reports is chronic from previous surgery and injury.  That is no different than her baseline.  She had intact sensation strength in legs.  Pupils are symmetric and reactive with normal extraocular movements.  No carotid bruit appreciated.  No neck tenderness on my exam.  Patient otherwise well-appearing with clear breath sounds and nontender chest or abdomen.  Clinically I suspect that patient had either syncope versus seizure 2 days ago.  With the shaking features I am somewhat concerned about new seizure.   Patient had a noncontrasted CT head in triage that did not show any evidence of acute abnormality but did show a small calcification versus meningioma.  I spoke to neurology who agreed that given the patient's shaking, it is reasonable for her to get MRI brain with and without to make sure this was not a focal seizure from this meningioma causing local edema or irritation.  He  did not feel she needed stat EEG at this time but if work-up remains reassuring and she is able to go home, he did feel she would likely do outpatient neurology follow-up and possible even outpatient EEG.  We will give a headache cocktail to help with her headache she is currently experiencing.  She had some labs in triage that were otherwise reassuring.  Of note, she does say she occasionally has a slower heart rate and the lowest I saw was in the 50s.  It appeared like a sinus mild bradycardia which she says she is had in the past.  Care will be transferred to oncoming team to await results of MRI and reassessment after headache cocktail.  If abnormalities are discovered, anticipate discussion with neurology.  If work-up is reassuring, dissipate discharge to follow-up with outpatient PCP versus cardiology for her mild bradycardia and follow-up with neurology for this possible seizure episode.  Anticipate reassessment by oncoming team.         Final Clinical Impression(s) / ED Diagnoses Final diagnoses:  Bad headache    Clinical Impression: 1. Bad headache     Disposition: Care transferred to oncoming team to await reassessment after headache cocktail and MRI.  This note was prepared with assistance of Conservation officer, historic buildings. Occasional wrong-word or sound-a-like substitutions may have occurred due to the inherent limitations of voice recognition software.      Kouper Spinella, Canary Brim, MD 06/03/22 [phone]

## 2022-06-03 NOTE — Discharge Instructions (Signed)
As discussed, your evaluation today has been largely reassuring.  But, it is important that you monitor your condition carefully, and do not hesitate to return to the ED if you develop new, or concerning changes in your condition. ? ?Otherwise, please follow-up with your physician for appropriate ongoing care. ? ?

## 2022-06-03 NOTE — ED Notes (Signed)
All discharge instructions reviewed with patient including follow up care. Patient and patient's significant other at bedside both verbalized understanding of same and had no other questions. Patient stable at time of discharge.

## 2022-06-03 NOTE — ED Provider Notes (Signed)
Care of the patient assumed at signout.9:52 PM MRI results reviewed, no acute intracranial abnormalities.  Patient and female companion both comfortable with the patient discharged, follow-up with outpatient clinic.   Carmin Muskrat, MD 06/03/22 2152

## 2023-02-18 ENCOUNTER — Ambulatory Visit: Payer: 59

## 2023-03-10 ENCOUNTER — Encounter (HOSPITAL_BASED_OUTPATIENT_CLINIC_OR_DEPARTMENT_OTHER): Payer: Self-pay

## 2023-03-10 DIAGNOSIS — G4710 Hypersomnia, unspecified: Secondary | ICD-10-CM

## 2023-07-21 ENCOUNTER — Inpatient Hospital Stay
Admission: RE | Admit: 2023-07-21 | Discharge: 2023-07-21 | Disposition: A | Discharge status: Home / Self Care | Payer: Self-pay | Source: Ambulatory Visit | Attending: Family Medicine | Admitting: Family Medicine

## 2023-07-21 ENCOUNTER — Other Ambulatory Visit: Payer: Self-pay | Admitting: *Deleted

## 2023-07-21 DIAGNOSIS — Z1231 Encounter for screening mammogram for malignant neoplasm of breast: Secondary | ICD-10-CM

## 2023-08-11 ENCOUNTER — Other Ambulatory Visit: Payer: Self-pay

## 2023-08-11 DIAGNOSIS — Z1231 Encounter for screening mammogram for malignant neoplasm of breast: Secondary | ICD-10-CM

## 2023-08-17 ENCOUNTER — Ambulatory Visit: Payer: Medicaid Other | Attending: Hematology and Oncology | Admitting: Hematology and Oncology

## 2023-08-17 ENCOUNTER — Ambulatory Visit
Admission: RE | Admit: 2023-08-17 | Discharge: 2023-08-17 | Disposition: A | Discharge status: Home / Self Care | Payer: 59 | Source: Ambulatory Visit | Attending: Obstetrics and Gynecology | Admitting: Obstetrics and Gynecology

## 2023-08-17 ENCOUNTER — Other Ambulatory Visit: Payer: Self-pay

## 2023-08-17 VITALS — BP 115/82 | Wt 222.0 lb

## 2023-08-17 DIAGNOSIS — Z1231 Encounter for screening mammogram for malignant neoplasm of breast: Secondary | ICD-10-CM | POA: Insufficient documentation

## 2023-08-17 DIAGNOSIS — Z1211 Encounter for screening for malignant neoplasm of colon: Secondary | ICD-10-CM

## 2023-08-17 NOTE — Patient Instructions (Signed)
Taught Caroline King about self breast awareness and gave educational materials to take home. Patient declined Pap smear today. Referred patient to the [facility] for screening mammogram. Appointment scheduled for 08/17/2023. Patient aware of appointment and will be there. Let patient know will follow up with her within the next couple weeks with results. Caroline King verbalized understanding.  Pascal Lux, NP 1:36 PM

## 2023-08-17 NOTE — Progress Notes (Signed)
Caroline King is a 47 y.o. female who presents to [facility] clinic today with no complaints.    Pap Smear: Pap not smear completed today. Last Pap smear was several years ago. Declines Pap smear today. She states these are very painful and she has had no abnormals in the past.   Physical exam: Breasts Breasts symmetrical. No skin abnormalities bilateral breasts. No nipple retraction bilateral breasts. No nipple discharge bilateral breasts. No lymphadenopathy. No lumps palpated bilateral breasts.        Pelvic/Bimanual Pap is not indicated today    Smoking History: Patient has never smoked and was not referred to quit line.    Patient Navigation: Patient education provided. Access to services provided for patient through [facility] program. No interpreter provided. No transportation provided   Colorectal Cancer Screening: Per patient has never had colonoscopy completed No complaints today. FIT test given.    Breast and Cervical Cancer Risk Assessment: Patient has family history of breast cancer, with her mother. Patient does not have history of cervical dysplasia, immunocompromised, or DES exposure in-utero.  Risk Scores as of Encounter on 08/17/2023     Caroline King           5-year 1.58%   Lifetime 14.06%            Last calculated by Caprice Red, CMA on 08/17/2023 at  1:21 PM        A: [facility] exam without pap smear No complaints with benign exam.   P: Referred patient to the [facility] for a screening mammogram. Appointment scheduled 08/17/2023.  Ilda Basset A, NP 08/17/2023 1:33 PM
